# Patient Record
Sex: Female | Born: 1950 | Race: White | Hispanic: No | Marital: Married | Smoking: Former smoker
Health system: Southern US, Community
[De-identification: ages and names within clinical notes are randomized; demographics above are authoritative.]

## PROBLEM LIST (undated history)

## (undated) DIAGNOSIS — G250 Essential tremor: Principal | ICD-10-CM

## (undated) DIAGNOSIS — I719 Aortic aneurysm of unspecified site, without rupture: Secondary | ICD-10-CM

## (undated) DIAGNOSIS — I10 Essential (primary) hypertension: Secondary | ICD-10-CM

## (undated) DIAGNOSIS — IMO0002 Reserved for concepts with insufficient information to code with codable children: Secondary | ICD-10-CM

## (undated) DIAGNOSIS — I499 Cardiac arrhythmia, unspecified: Secondary | ICD-10-CM

## (undated) DIAGNOSIS — F419 Anxiety disorder, unspecified: Secondary | ICD-10-CM

## (undated) DIAGNOSIS — I471 Supraventricular tachycardia: Secondary | ICD-10-CM

## (undated) DIAGNOSIS — G252 Other specified forms of tremor: Principal | ICD-10-CM

## (undated) DIAGNOSIS — K2100 Gastro-esophageal reflux disease with esophagitis, without bleeding: Secondary | ICD-10-CM

## (undated) DIAGNOSIS — J449 Chronic obstructive pulmonary disease, unspecified: Secondary | ICD-10-CM

## (undated) HISTORY — PX: TOTAL THYROIDECTOMY: SHX2547

## (undated) HISTORY — PX: TONSILLECTOMY: SUR1361

## (undated) HISTORY — PX: HEMORROIDECTOMY: SUR656

## (undated) HISTORY — PX: CHOLECYSTECTOMY: SHX55

## (undated) HISTORY — DX: Essential (primary) hypertension: I10

## (undated) HISTORY — DX: Cardiac arrhythmia, unspecified: I49.9

## (undated) HISTORY — DX: Gastro-esophageal reflux disease with esophagitis: K21.0

## (undated) HISTORY — PX: VAGINAL HYSTERECTOMY: SUR661

## (undated) HISTORY — PX: OTHER SURGICAL HISTORY: SHX169

## (undated) HISTORY — PX: BACK SURGERY: SHX140

## (undated) HISTORY — DX: Other specified forms of tremor: G25.2

## (undated) HISTORY — DX: Gastro-esophageal reflux disease with esophagitis, without bleeding: K21.00

## (undated) HISTORY — PX: NECK SURGERY: SHX720

## (undated) HISTORY — DX: Essential tremor: G25.0

## (undated) HISTORY — DX: Supraventricular tachycardia: I47.1

## (undated) HISTORY — DX: Reserved for concepts with insufficient information to code with codable children: IMO0002

## (undated) HISTORY — PX: OOPHORECTOMY: SHX86

## (undated) HISTORY — DX: Chronic obstructive pulmonary disease, unspecified: J44.9

## (undated) HISTORY — PX: LAPAROTOMY: SHX154

## (undated) HISTORY — PX: FOOT SURGERY: SHX648

## (undated) HISTORY — DX: Anxiety disorder, unspecified: F41.9

---

## 2001-07-24 ENCOUNTER — Ambulatory Visit (HOSPITAL_COMMUNITY): Admission: RE | Admit: 2001-07-24 | Discharge: 2001-07-24 | Payer: Self-pay

## 2001-12-29 ENCOUNTER — Encounter: Payer: Self-pay | Admitting: *Deleted

## 2001-12-29 ENCOUNTER — Emergency Department (HOSPITAL_COMMUNITY): Admission: EM | Admit: 2001-12-29 | Discharge: 2001-12-29 | Payer: Self-pay | Admitting: *Deleted

## 2002-08-21 ENCOUNTER — Inpatient Hospital Stay (HOSPITAL_COMMUNITY): Admission: EM | Admit: 2002-08-21 | Discharge: 2002-08-23 | Payer: Self-pay | Admitting: *Deleted

## 2002-08-21 ENCOUNTER — Encounter: Payer: Self-pay | Admitting: *Deleted

## 2002-10-08 ENCOUNTER — Encounter: Payer: Self-pay | Admitting: Emergency Medicine

## 2002-10-08 ENCOUNTER — Emergency Department (HOSPITAL_COMMUNITY): Admission: EM | Admit: 2002-10-08 | Discharge: 2002-10-08 | Payer: Self-pay | Admitting: Emergency Medicine

## 2003-02-19 ENCOUNTER — Ambulatory Visit (HOSPITAL_COMMUNITY): Admission: RE | Admit: 2003-02-19 | Discharge: 2003-02-19 | Payer: Self-pay | Admitting: Pulmonary Disease

## 2003-07-04 ENCOUNTER — Encounter (HOSPITAL_COMMUNITY): Admission: RE | Admit: 2003-07-04 | Discharge: 2003-08-03 | Payer: Self-pay | Admitting: Cardiology

## 2005-10-29 ENCOUNTER — Ambulatory Visit (HOSPITAL_COMMUNITY): Admission: RE | Admit: 2005-10-29 | Discharge: 2005-10-29 | Payer: Self-pay | Admitting: Pulmonary Disease

## 2006-06-29 ENCOUNTER — Ambulatory Visit (HOSPITAL_COMMUNITY): Admission: RE | Admit: 2006-06-29 | Discharge: 2006-06-29 | Payer: Self-pay | Admitting: Pulmonary Disease

## 2006-07-14 ENCOUNTER — Ambulatory Visit (HOSPITAL_COMMUNITY): Admission: RE | Admit: 2006-07-14 | Discharge: 2006-07-14 | Payer: Self-pay | Admitting: Obstetrics and Gynecology

## 2006-07-28 ENCOUNTER — Ambulatory Visit: Payer: Self-pay | Admitting: Cardiology

## 2006-08-04 ENCOUNTER — Encounter (INDEPENDENT_AMBULATORY_CARE_PROVIDER_SITE_OTHER): Payer: Self-pay | Admitting: Specialist

## 2006-08-04 ENCOUNTER — Inpatient Hospital Stay (HOSPITAL_COMMUNITY): Admission: RE | Admit: 2006-08-04 | Discharge: 2006-08-08 | Payer: Self-pay | Admitting: Obstetrics and Gynecology

## 2007-02-21 ENCOUNTER — Ambulatory Visit (HOSPITAL_COMMUNITY): Admission: RE | Admit: 2007-02-21 | Discharge: 2007-02-21 | Payer: Self-pay | Admitting: Pulmonary Disease

## 2007-03-01 ENCOUNTER — Ambulatory Visit (HOSPITAL_COMMUNITY): Admission: RE | Admit: 2007-03-01 | Discharge: 2007-03-01 | Payer: Self-pay | Admitting: Pulmonary Disease

## 2009-01-02 ENCOUNTER — Encounter (HOSPITAL_COMMUNITY): Admission: RE | Admit: 2009-01-02 | Discharge: 2009-02-01 | Payer: Self-pay | Admitting: Endocrinology

## 2009-08-20 ENCOUNTER — Ambulatory Visit (HOSPITAL_COMMUNITY): Admission: RE | Admit: 2009-08-20 | Discharge: 2009-08-20 | Payer: Self-pay | Admitting: Pulmonary Disease

## 2010-01-27 ENCOUNTER — Ambulatory Visit (HOSPITAL_COMMUNITY): Admission: RE | Admit: 2010-01-27 | Discharge: 2010-01-27 | Payer: Self-pay | Admitting: Pulmonary Disease

## 2010-10-15 ENCOUNTER — Inpatient Hospital Stay (HOSPITAL_COMMUNITY): Admission: EM | Admit: 2010-10-15 | Discharge: 2010-05-05 | Payer: Self-pay | Admitting: Emergency Medicine

## 2011-01-24 LAB — BASIC METABOLIC PANEL
BUN: 7 mg/dL (ref 6–23)
Calcium: 9.1 mg/dL (ref 8.4–10.5)
Creatinine, Ser: 0.87 mg/dL (ref 0.4–1.2)
GFR calc Af Amer: >60 mL/min (ref >60)
GFR calc non Af Amer: >60 mL/min (ref >60)
Glucose, Bld: 89 mg/dL (ref 70–99)
Potassium: 3.9 mEq/L (ref 3.5–5.1)
Sodium: 134 mEq/L — ABNORMAL LOW (ref 135–145)

## 2011-01-24 LAB — URINALYSIS, ROUTINE W REFLEX MICROSCOPIC
Bilirubin Urine: NEGATIVE
Nitrite: NEGATIVE
Protein, ur: NEGATIVE mg/dL
Specific Gravity, Urine: 1.01 (ref 1.005–1.030)
Urobilinogen, UA: 0.2 mg/dL (ref 0.0–1.0)

## 2011-01-24 LAB — URINE CULTURE
Colony Count: NO GROWTH
Culture: NO GROWTH

## 2011-01-24 LAB — CBC
HCT: 39.3 % (ref 36.0–46.0)
MCH: 31.8 pg (ref 26.0–34.0)
MCHC: 34 g/dL (ref 30.0–36.0)
MCV: 93.6 fL (ref 78.0–100.0)
RBC: 4.2 MIL/uL (ref 3.87–5.11)
RDW: 13.2 % (ref 11.5–15.5)
WBC: 12.5 10*3/uL — ABNORMAL HIGH (ref 4.0–10.5)

## 2011-01-24 LAB — DIFFERENTIAL
Eosinophils Relative: 1 % (ref 0–5)
Monocytes Absolute: 0.8 10*3/uL (ref 0.1–1.0)

## 2011-01-24 LAB — POCT CARDIAC MARKERS
CKMB, poc: 1.4 ng/mL (ref 1.0–8.0)
CKMB, poc: 3.3 ng/mL (ref 1.0–8.0)
Myoglobin, poc: 249 ng/mL (ref 12–200)
Troponin i, poc: <0.05 ng/mL (ref 0.00–0.09)

## 2011-01-24 LAB — URINE MICROSCOPIC-ADD ON

## 2011-01-24 LAB — LACTIC ACID, PLASMA: Lactic Acid, Venous: 1.1 mmol/L (ref 0.5–2.2)

## 2011-03-26 NOTE — Discharge Summary (Signed)
   NAME:  Aimee Rodriguez, Aimee Rodriguez                           ACCOUNT NO.:  [id number]   MEDICAL RECORD NO.:  [id number]                   PATIENT TYPE:  INP   LOCATION:  A208                                 FACILITY:  [facility]   PHYSICIAN:  Edward L. Juanetta Gosling, M.D.             DATE OF BIRTH:  [birth year]   DATE OF ADMISSION:  08/21/2002  DATE OF DISCHARGE:  08/23/2002                                 DISCHARGE SUMMARY   FINAL DISCHARGE DIAGNOSES:  1. Chronic obstructive pulmonary disease with exacerbation  2. Hypertension.  3. Chronic low back pain.  4. Anxiety.   HISTORY:  The patient is a 60 year old who has a long known history of  problems with breathing and who presented to the emergency room acutely  short of breath.  When she was seen in the emergency room she was noted to  be very short of breath, had a great deal of cough and congestion, and was  admitted to the hospital at that point.  She had been trying over-the-  counter medications at home with no relief.  Her physical exam on admission showed that her chest had marked bilateral  rhonchi, respiratory rate was about 30, her blood pressure in the 120 range,  her abdomen was soft, extremities showed no edema.   HOSPITAL COURSE:  She was treated with intravenous steroids, IV antibiotics,  inhaled bronchodilators, and improved rapidly.  She was discharged home in  improved condition on August 23, 2002.   DISCHARGE MEDICATIONS:  1. Prednisone 40 mg x2 days, 20 mg x2 days, 10 mg x2 days, and then stop.  2. Omnicef 300 mg b.i.d.  3. Codiclear DH 5 cc q.4h. p.r.n. cough.  4. Inhaled albuterol via metered dose inhaler two puffs q.i.d.  5. She is going to continue with her previous medications which include:     a. Klonopin 1 mg at night.     b. Xanax 1 mg t.i.d.     c. Atenolol 25 mg daily.                                               Edward L. Juanetta Gosling, M.D.    ELH/MEDQ  D:  08/23/2002  T:  08/23/2002  Job:  [id number]

## 2011-03-26 NOTE — Letter (Signed)
July 28, 2006     [name], M.D.  [address] [place], [place] [address]   RE:  [name], [name]  MRN:  [id number]  /  DOB:  1951/08/01   Dear [name]:   It was my pleasure assessing Aimee Rodriguez in the office today at your  request and reporting to you concerning her cardiologic status prior to  planned oophorectomy.  This nice woman was first seen by our group in 1997  when she presented with a 10-year history of palpitations.  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  She  had recurrent palpitations a few years later when she was treated with  thyroid hormone and no significant arrhythmias were identified.  A few years  ago, she experienced chest pain, dyspnea and lightheadedness, but did not  have palpitations on that occasion.  It sounds as if she had excessive  bradycardia from medication which was adjusted with resolution of her  symptoms.  She underwent a stress nuclear study at that time which was  negative.  Her continuing cardiovascular risk factors have been principally  use of tobacco products, which she discontinued entirely nine months ago.  She has had hypertension that has been well controlled.  She has been told  that recent cholesterol values were good.  I have only a remote profile from  the year 2000 that was fine.  She has not had diabetes.  She has had no  other vascular problems.   Aimee Rodriguez underwent partial hysterectomy some years ago.  Plans are now  for oophorectomy.   Past medical, family and social history were updated as well as a review of  systems.  She required partial thyroidectomy a few years ago and has been  maintained on levothyroxine.  She was previously treated with a PPI for  GERD, but not recently.  Blood pressure control has apparently been good.  She takes small doses of anti-anxiety medication.   She has no known medical allergies.  She has had nausea when treated  with  CODEINE and DEMEROL.   Her total tobacco consumption was approximately 20-30 pack years.  She  denies excessive use of alcohol.  She has been disabled due to back pain.  She remarried two years ago and has four children from prior marriage.   PHYSICAL EXAMINATION:  GENERAL:  Pleasant, thin woman with somewhat raspy  voice in no acute distress.  VITAL SIGNS:  Weight is 121, eight pounds less than in 2004.  Blood pressure  initially 140/100; subsequently 140/85.  Heart rate 65 and regular.  Respirations 16.  NECK:  No jugular venous distension.  Normal carotid upstrokes without  bruits.  HEENT:  Normal funduscopic examination. EOMs full.  Pupils are equal, round  and reactive to light.  Normal oral mucosa.  ENDOCRINE:  Transverse surgical scar at the base of the neck; no  thyromegaly.  HEMATOPOIETIC:  No adenopathy.  LUNGS:  Clear.  CARDIAC:  Normal first and second heart sounds.  Fourth heart sound present.  ABDOMEN:  Soft and nontender.  No masses.  No organomegaly.  Aortic  pulsation easily palpated.  Appears normal in width.  EXTREMITIES:  No edema.  Normal distal pulses.  NEUROMUSCULAR:  Symmetric strength and tone; normal cranial nerves.  MUSCULOSKELETAL:  No joint deformities.   EKG:  Normal sinus rhythm.  Delayed R wave progression.  Otherwise normal.   IMPRESSION:  Aimee Rodriguez has a history  of paroxysmal supraventricular  tachycardia that was cured with radiofrequency ablation. She has a number of  cardiovascular risk factors, but no known coronary nor vascular disease.  With reasonable exercise tolerance and no symptoms to suggest myocardial  ischemia, no preoperative testing is warranted.  She has issues of  bradycardia on beta blocker. Her current dose of beta blocker is nearly  homeopathic.  I suggested that she continue to take it in the perioperative  interval but discontinue it subsequently.  It sounds as if control of  hypertension is not optimal. She will  collect additional blood pressure  values over the next few weeks and return for reassessment thereafter.  In  the meantime, we will collect recent laboratory studies.  Please call us if  we can assist when she is in hospital for her surgery.   Thanks so much for sending her to [place].   Sincerely,     Gerrit Friends. Dietrich Pates, MD, [facility]   RMR/MedQ DD:  07/28/2006 DT:  07/30/2006 Job #:  [id number]   CC:    Ramon Dredge L. Juanetta Gosling, M.D.

## 2011-03-26 NOTE — Op Note (Signed)
Aimee Rodriguez, Aimee Rodriguez              ACCOUNT NO.:  [id number]   MEDICAL RECORD NO.:  [id number]          PATIENT TYPE:  INP   LOCATION:  A427                          FACILITY:  [facility]   PHYSICIAN:  [name], M.D. DATE OF BIRTH:  1951-10-26   DATE OF PROCEDURE:  DATE OF DISCHARGE:                                 OPERATIVE REPORT   PREOPERATIVE DIAGNOSIS:  Right ovarian mass.   POSTOPERATIVE DIAGNOSIS:  Mucinous cystadenoma, right ovary.   PROCEDURE:  Bilateral salpingo-oophorectomy.   SURGEON:  [name], M.D.   ASSISTANTAnnabell Howells, R.N.   ANESTHESIA:  General.   COMPLICATIONS:  None.   ESTIMATED BLOOD LOSS:  Minimal.   FINDINGS:  Smooth mobile 10-11 cm cyst encompassing right ovary, easily  mobilized with minimal adhesions.   DESCRIPTION OF PROCEDURE:  The patient was taken to the operating room,  prepped and draped for lower abdominal surgery with lower abdominal vertical  incision performed. The peritoneal cavity was opened, pelvic washings taken  and the omentum inspected and found to be visually normal. The liver edges  were palpably normal. Bowel was packed way and pelvis inspected revealing  the smooth, mobile, easily mobilized right tube and ovary. The right ureter  could be identified and was well out of the surgical field. The  infundibulopelvic ligament was cross clamped in 2 pedicles, transected and  specimen sent for frozen section. The 2 pedicle components were each sutured  individually. There was some oozing between and a third suture was used to  complete hemostasis. As stated earlier, the ureter was well out of the area.   The opposite side was addressed while awaiting frozen section results. The  left tube and ovary were smaller, atrophic and were easily identified,  placed on traction, the round ligament taken down first with #0 chromic and  then the infundibulopelvic ligament on the left isolated, clamped, cut and  suture ligated. The pelvis  was irrigated from the minimal amount of bleeding  that had occurred and then the patient allowed to go to the recovery room  after removal of laparotomy equipment and closure of the peritoneum with 2-0  chromic closure of the peritoneal cavity,  continuous running #0 Prolene closure of the fascia followed by subcuticular  running 2-0 plain suture, followed by stapled closure of the skin. The  patient tolerated the procedure well and went to the recovery room in good  condition.      [name], M.D.  Electronically Signed     JVF/MEDQ  D:  08/04/2006  T:  08/06/2006  Job:  [id number]   cc:   Ramon Dredge L. Juanetta Gosling, M.D.  Fax: [phone]

## 2011-03-26 NOTE — Group Therapy Note (Signed)
   NAME:  Aimee Rodriguez, Aimee Rodriguez                           ACCOUNT NO.:  [id number]   MEDICAL RECORD NO.:  [id number]                   PATIENT TYPE:  INP   LOCATION:  A208                                 FACILITY:  [facility]   PHYSICIAN:  Edward L. Juanetta Gosling, M.D.             DATE OF BIRTH:  07-25-1951   DATE OF PROCEDURE:  08/22/2002  DATE OF DISCHARGE:                                   PROGRESS NOTE   SUBJECTIVE:  The patient says that she feels much better.  She was admitted  with an exacerbation of COPD.  She says that she is much improved.  She is  not coughing.  She is not as short of breath.   PHYSICAL EXAMINATION:  CHEST:  Clear with some end-expiratory rhonchi.  HEART:  Regular without gallop.  ABDOMEN:  Soft.  She does not appear to be in acute distress now.   ASSESSMENT:  She does seem to be doing much better.   PLAN:  The plan is to continue with her treatments.  Continue with her  medications.  No changes now.  I am going to check again this evening.  If  she is doing as well as she is now, I may switch her to oral medications and  then she may be able to be discharged fairly soon.                                               Edward L. Juanetta Gosling, M.D.    ELH/MEDQ  D:  08/22/2002  T:  08/23/2002  Job:  [id number]

## 2011-03-26 NOTE — Group Therapy Note (Signed)
   NAME:  Aimee Rodriguez, Aimee Rodriguez                           ACCOUNT NO.:  [id number]   MEDICAL RECORD NO.:  [id number]                   PATIENT TYPE:  INP   LOCATION:  A208                                 FACILITY:  [facility]   PHYSICIAN:  Edward L. Juanetta Gosling, M.D.             DATE OF BIRTH:  08-10-1951   DATE OF PROCEDURE:  08/23/2002  DATE OF DISCHARGE:                                   PROGRESS NOTE   PROBLEM:  Chronic obstructive pulmonary disease with exacerbation.   SUBJECTIVE:  The patient says she is feeling very well this morning and has  no new complaints.  She denies any nausea, vomiting, or diarrhea.  She is  coughing some but not bringing much up.   ASSESSMENT:  She is doing very well.   PLAN:  For discharge today.  See discharge summary for details.                                               Edward L. Juanetta Gosling, M.D.    ELH/MEDQ  D:  08/23/2002  T:  08/24/2002  Job:  [id number]

## 2011-03-26 NOTE — Discharge Summary (Signed)
[name], Aimee Rodriguez              ACCOUNT NO.:  [id number]   MEDICAL RECORD NO.:  [id number]          PATIENT TYPE:  INP   LOCATION:  A427                          FACILITY:  [facility]   PHYSICIAN:  [name], M.D. DATE OF BIRTH:  Apr 07, 1951   DATE OF ADMISSION:  08/04/2006  DATE OF DISCHARGE:  10/01/2007LH                                 DISCHARGE SUMMARY   DIAGNOSIS:  Right ovarian mass, suspected benign.   DISCHARGE DIAGNOSES:  1. Benign mucinous cyst adenoma right ovary.  2. Bronchiolitis with atelectasis and pleural effusion.  3. Postoperative ileus, resolved.  4. Chronic bronchitis due to cigarette smoking.   SURGICAL PROCEDURE:  Laparotomy with bilateral salpingo-oophorectomy  August 04, 2006.   HOSPITAL SUMMARY:  This 60 year old female many years status post vaginal  hysterectomy was admitted for laparotomy removal of ovarian mass and  probable bilateral salpingo-oophorectomy.  Please see the operative note for  details.  Frozen section indicated a mucinous cyst adenoma as the diagnosis.   Postoperatively, the patient did well for 24-48 hours, but then had  postoperative ileus.  She ended up two additional days due to upper  respiratory problems.  She has a long history of chronic bronchitis,  worsened by smoking.  The patient had azithromycin restarted, produced a  slightly bloody mucous sputum.  She improved on the azithromycin.  She did  have an additional postoperative adynamic ileus developed, but had an  essentially normal white count.  At the time of discharge, white count is  10,900, with 73 neutrophils, hemoglobin 12, hematocrit 35.  The patient was  improved on the final day of hospitalization, remaining afebrile and was  discharged home in stable condition to be seen back in four days for staple  removal.      [name], M.D.  Electronically Signed     JVF/MEDQ  D:  08/08/2006  T:  08/09/2006  Job:  [id number]   cc:   [facility]   Oneal Deputy. Juanetta Gosling, M.D.  Fax: [phone]

## 2011-03-26 NOTE — H&P (Signed)
NAME:  Aimee Rodriguez, STEGMAN              ACCOUNT NO.:  [id number]   MEDICAL RECORD NO.:  [id number]          PATIENT TYPE:  AMB   LOCATION:  DAY                           FACILITY:  [facility]   PHYSICIAN:  [name], M.D. DATE OF BIRTH:  1951-09-24   DATE OF ADMISSION:  DATE OF DISCHARGE:  LH                                HISTORY & PHYSICAL   ADMISSION DIAGNOSES:  Right ovarian mass, suspected benign ovarian tumor,  scheduled for laparotomy and bilateral salpingo-oophorectomy.   HISTORY OF PRESENT ILLNESS:  This 60 year old female many years status post  vaginal hysterectomy performed June 09, 1992, was admitted for laparotomy  and removal of tubes and ovaries bilaterally.  She has an ovarian mass found  on CT scan, found by Dr. Juanetta Gosling in referral.  CT scan showed a large pelvic  mass, 11 cm maximum diameter by 7.2 x8.0 cm.  There is no lymphadenopathy.  There is no free pelvic fluid.  There is some mild compression effect on the  right ureter.  There is no evidence of metastatic disease or lymph node  enlargement. CA125 has been ordered which is within normal range at 16.5  making malignancy highly unlikely.  The patient is aware that final  diagnosis awaits pathology interpretation.   REVIEW OF SYSTEMS:  Positive for a sense of pelvic pressure and a sense of  incomplete voiding.  CT was ordered as part of this work-up.   PAST MEDICAL HISTORY:  Notable for heart disease managed by Dr. Juanetta Gosling and  Dr. Dietrich Pates.  She has been cleared for surgery by cardiology on 9/20.  Hypertension, controlled.  History of paroxysmal supraventricular  tachycardia (PSVT) which was cured by ablation of the aberrant conducting  pathway 1997.   PAST SURGICAL HISTORY:  Vaginal hysterectomy in 1993, back fusion 1993,  tonsillectomy as a child, foot surgery, neck fusion 1994, colon polypectomy  2001.   PHYSICAL EXAMINATION:  GENERAL:  Shows a slim Caucasian female, alert and  oriented x3.  HEENT:   Pupils equal, round and reactive to light.  NECK:  Supple.  CARDIAC:  Regular rate and rhythm.  Pulse 70's.  ABDOMEN:  Nontender to palpation.  Bimanual exam:  Sensitive mass in the  right side, 11 cm, filling pelvis nicely.  EXTREMITIES:  Negative for cyanosis, clubbing or edema.   PLAN:  Laparotomy with removal of ovarian mass planned for August 04, 2006 at 9:30 a.m.      [name], M.D.  Electronically Signed     JVF/MEDQ  D:  08/03/2006  T:  08/04/2006  Job:  [id number]   cc:   Ramon Dredge L. Juanetta Gosling, M.D.  Fax: [phone]

## 2011-03-26 NOTE — H&P (Signed)
NAME:  Aimee Rodriguez, Aimee Rodriguez                           ACCOUNT NO.:  [id number]   MEDICAL RECORD NO.:  [id number]                   PATIENT TYPE:  INP   LOCATION:  A208                                 FACILITY:  [facility]   PHYSICIAN:  Hanley Hays. Dechurch, M.D.           DATE OF BIRTH:  [birth year]   DATE OF ADMISSION:  08/21/2002  DATE OF DISCHARGE:                                HISTORY & PHYSICAL   HISTORY OF PRESENT ILLNESS:  A 60 year old white female patient of Dr.  Juanetta Gosling with a past medical history of seasonal bronchitis, coronary artery  disease, status post stent in 1997, hypertension, who is disabled secondary  to chronic back pain and multiple other issues who presented with a three-  day history of increasing cough, wheezing, shortness of breath not  responsive to outpatient over-the-counter therapy.  She received nebulizers  and Solu-Medrol in the emergency room without improvement.  Blood gas  revealed a PCO2 of 37, PO2 of 48, pH 7.4 on room air.  Chest x-ray reveals  no infiltrate or other changes.  She was admitted to the hospital for  ongoing treatment.   MEDICATIONS:  1. Atenolol 25 daily.  2. Aspirin 325 q.o.d.  3. Levoxyl dose unknown.  4. Xanax 1 mg t.i.d.  5. Klonopin 1 mg at bedtime.  6. Altace 5 mg daily.  7. Nexium 40 mg daily.  8. Nitroglycerin.  The patient just recently learned how to use it.   ALLERGIES:  1. CODEINE.  2. DEMEROL.   PAST MEDICAL HISTORY:  1. Seasonal bronchitis usually in the spring.  2. Coronary artery disease, status post stent in 1997, followed by Dr.     Dietrich Pates though not seen in the last two years.  3. Lumbosacral disk surgery.  4. Cholecystectomy.  5. Hysterectomy.  6. Thyroidectomy followed by Dr. Patrecia Pace.  7. Colon polyps.   REVIEW OF SYSTEMS:  Headache over the last six months, especially in the  morning and occasionally at night.  Chest pain with exertion.  She tends to  limit her exertion so as not to bring it on.  She  has had no recent exercise  studies or followup by cardiology.  No prolonged pain.  She recently learned  how to use nitroglycerin she says.  She notes occasional left arm pain which  will go away with rest.  Poor sleep.  Her weight is stable though she has a  hard time maintaining it or so she says.  Esophagitis helped with Nexium.  History of blockages in  the neck though I cannot find any documentation to  support this.  No endocrine complaints.  GU is negative.  She is status post  hysterectomy but had intact ovaries.   LABORATORY DATA:  No labs were obtained during the ER evaluation.   SOCIAL HISTORY:  She has a fiance.  She has four children, one with thyroid  problems.  Tobacco:  About eight cigarettes per day.  Alcohol:  No abuse.  Disabled.   FAMILY HISTORY:  Coronary artery disease, hypertension, diabetes mellitus.  Her mother had chronic lung problems.   PHYSICAL EXAMINATION:  GENERAL:  Well-developed, well-nourished thin white  female with a coarse cough.  No acute shortness of breath.  Somewhat  tremulous from her multiple medications.  NECK:  Supple.  No JVD or adenopathy.  The oropharynx is moist.  No thrush.  LUNGS:  Coarse, leathery rhonchi throughout with poor air movement.  HEART:  Regular.  Rate is 88.  No murmur.  ABDOMEN:  Flat, soft, nontender.  No mass.  EXTREMITIES:  Without clubbing, cyanosis, or edema.  NEUROLOGIC:  Grossly intact.   ASSESSMENT:  1. Hypoxemic respiratory failure.  2. Chronic obstructive pulmonary disease exacerbation.  3. Coronary artery disease by history with question of exertional symptoms     although no pain this evening or other new complaints.  4. History of chronic anxiety.   PLAN:  1. The patient will be admitted to 2A.  She will continue Solu-Medrol,     Rocephin, and nebulizer therapy and oxygen at this point and be     reevaluated after 24-48 hours of therapy.  Continue her cardioprotective     medications.  Consider  cardiology referral.  No acute need at this point.     This was discussed with the patient.  2. Chronic anxiety.  Unlikely this patient will want to change her regimen.     We discussed this at length.  No changes at this time.  We will defer to     primary physician.  3. History of hypothyroidism on Levoxyl.  Dose will need to be confirmed.  4. Reflux esophagitis.  Continue proton pump inhibitor during hospital stay.                                                     Hanley Hays Josefine Class, M.D.    FED/MEDQ  D:  08/21/2002  T:  08/22/2002  Job:  [id number]

## 2011-11-10 ENCOUNTER — Other Ambulatory Visit (HOSPITAL_COMMUNITY): Payer: Self-pay | Admitting: Pulmonary Disease

## 2011-11-10 ENCOUNTER — Ambulatory Visit (HOSPITAL_COMMUNITY)
Admission: RE | Admit: 2011-11-10 | Discharge: 2011-11-10 | Disposition: A | Discharge status: Home / Self Care | Payer: Medicare Other | Source: Ambulatory Visit | Attending: Pulmonary Disease | Admitting: Pulmonary Disease

## 2011-11-10 DIAGNOSIS — J4489 Other specified chronic obstructive pulmonary disease: Secondary | ICD-10-CM | POA: Insufficient documentation

## 2011-11-10 DIAGNOSIS — J449 Chronic obstructive pulmonary disease, unspecified: Secondary | ICD-10-CM | POA: Insufficient documentation

## 2011-11-10 DIAGNOSIS — R05 Cough: Secondary | ICD-10-CM

## 2011-11-10 DIAGNOSIS — R059 Cough, unspecified: Secondary | ICD-10-CM | POA: Insufficient documentation

## 2011-11-10 LAB — HEPATIC FUNCTION PANEL
ALT: 18 U/L (ref 7–35)
AST: 20 U/L
Alkaline Phosphatase: 58 U/L
Bilirubin, Direct: 0.1 mg/dL (ref 0.01–0.4)
Bilirubin, Indirect: 0.3
Total Protein: 6.2 g/dL

## 2011-11-10 LAB — CBC WITH DIFFERENTIAL/PLATELET
HCT: 48 %
Hemoglobin: 16.2 g/dL — AB (ref 12.0–16.0)
MCH: 31.3
MCHC: 33.7
MCV: 93 fL
RDW: 13.4

## 2011-11-23 ENCOUNTER — Ambulatory Visit (INDEPENDENT_AMBULATORY_CARE_PROVIDER_SITE_OTHER): Payer: Medicare Other | Admitting: Gastroenterology

## 2011-11-23 ENCOUNTER — Encounter: Payer: Self-pay | Admitting: Gastroenterology

## 2011-11-23 VITALS — BP 138/93 | HR 81 | Temp 98.2°F | Ht 63.0 in | Wt 96.8 lb

## 2011-11-23 DIAGNOSIS — R1013 Epigastric pain: Secondary | ICD-10-CM

## 2011-11-23 DIAGNOSIS — R634 Abnormal weight loss: Secondary | ICD-10-CM

## 2011-11-23 MED ORDER — OMEPRAZOLE 20 MG PO CPDR: 20.0000 mg | DELAYED_RELEASE_CAPSULE | Freq: Every day | ORAL | Status: DC

## 2011-11-23 NOTE — Assessment & Plan Note (Signed)
Concerning for occult malignancy. Will need to assess with EGD due to prominent UGI symptoms to include epigastric pain, dysphagia. No melena, hematochezia, change in bowel habits noted. Will hold off on colonoscopy unless indicated. Retrieving labs from Dr. Juanetta Gosling as well as plan regarding work-up for nodule on neck. EGD as planned.

## 2011-11-23 NOTE — Progress Notes (Signed)
Faxed to PCP

## 2011-11-23 NOTE — Assessment & Plan Note (Signed)
61 year old with epigastric pain, "ice sensation" in epigastric region with eating and drinking since Nov 2012. No associated nausea, vomiting, loss of appetite. Reports significant wt loss from 118 in November to currently 96, unintentionally. Takes Aleve sparingly for back pain, no melena or hematochezia noted. Associated dysphagia with eating, pt attributes to possible nodule anterior neck. Last EGD and TCS by Dr. Lovell Sheehan in remote past, records requested. Concerning for possible underlying malignancy due to constellation of symptoms. Anticipate CT scan in future after EGD is completed. Will need to discuss with Dr. Juanetta Gosling the plan for possible nodule.   Proceed with upper endoscopy in the near future with Dr. Jena Gauss. The risks, benefits, and alternatives have been discussed in detail with patient. They have stated understanding and desire to proceed.  Obtain labs from Dr. Juanetta Gosling Anticipate further work-up to include CT scan in near future Start Prilosec 20 mg daily Obtain reports from EGD/TCS by Dr. Lovell Sheehan

## 2011-11-23 NOTE — Progress Notes (Signed)
Primary Care Physician:  Fredirick Maudlin, MD, MD Primary Gastroenterologist:  Dr. Jena Gauss  Chief Complaint  Patient presents with  . Abdominal Pain    HPI:   [name] is a 61 year old female who presents today at the request of Dr. Juanetta Gosling secondary to abdominal pain and weight loss. She reports epigastric pain with eating, intermittent, since Nov 2012. States it feels at times like a "cold spot" with eating and drinking. She reports sensation of "ice" in her abdomen. Reports feeling cold all the time. Also notes dysphagia, "knot" in her throat. Has BM every 2 days, denies constipation or diarrhea. No melena or hematochezia. No loss of appetite, nausea or vomiting. She has lost significant amount of weight from 118 in November to currently 96 pounds. Only takes Aleve prn. Last EGD and TCS were done by Lovell Sheehan, date unknown, but patient believes at least 6 or 7 years ago. She reports polyps were removed but believes they were benign. Extensive labs were ordered by Dr. Juanetta Gosling, and we have requested these. Patient also reports a "knot" anterior throat. Feels like it is "tight". Apparently, Dr. Juanetta Gosling will be ordering some type of ultrasound or scan for this. I will need to get in touch with him to discuss the actual plan.   Past Medical History  Diagnosis Date  . Degeneration of intervertebral disc   . Cardiac dysrhythmia, unspecified   . Anxiety   . Reflux esophagitis   . HTN (hypertension)   . Chronic bronchitis   . COPD (chronic obstructive pulmonary disease)   . Paroxysmal supraventricular tachycardia     s/p ablation in past    Past Surgical History  Procedure Date  . Laparotomy   . Total thyroidectomy   . Vaginal hysterectomy   . Oophorectomy   . Back surgery   . Tonsillectomy   . Foot surgery     X2  . Neck surgery   . Polyps of colon   . Cholecystectomy   . Cardiac ablation for paroxysmal supraventricular tachycardia     Current Outpatient Prescriptions    Medication Sig Dispense Refill  . albuterol (PROVENTIL) (2.5 MG/3ML) 0.083% nebulizer solution Take 2.5 mg by nebulization every 6 (six) hours as needed.      . ALPRAZolam (XANAX) 1 MG tablet Take 1 mg by mouth 3 (three) times daily as needed.      . clonazePAM (KLONOPIN) 1 MG tablet Take 1 mg by mouth 2 (two) times daily as needed.      Marland Kitchen guaiFENesin (MUCINEX) 600 MG 12 hr tablet Take 1,200 mg by mouth 2 (two) times daily.      Marland Kitchen omeprazole (PRILOSEC) 20 MG capsule Take 1 capsule (20 mg total) by mouth daily. Take 30 minutes before breakfast daily  30 capsule  1  . ramipril (ALTACE) 5 MG capsule         Allergies as of 11/23/2011 - Review Complete 11/23/2011  Allergen Reaction Noted  . Codeine Nausea And Vomiting 11/23/2011  . Demerol Nausea And Vomiting 11/23/2011  . Sulfa antibiotics Hives 11/23/2011    Family History  Problem Relation Age of Onset  . Cancer Brother   . Cancer Sister   . Colon cancer Neg Hx     History   Social History  . Marital Status: Married    Spouse Name: N/A    Number of Children: N/A  . Years of Education: N/A   Occupational History  . Not on file.   Social History Main Topics  .  Smoking status: Former Smoker -- 1.0 packs/day    Types: Cigarettes  . Smokeless tobacco: Not on file   Comment: quit about 3 yrs   . Alcohol Use: No  . Drug Use: No  . Sexually Active: Not on file   Other Topics Concern  . Not on file   Social History Narrative  . No narrative on file    Review of Systems: Gen: SEE HPI CV: Denies chest pain, heart palpitations, peripheral edema, syncope.  Resp: Denies shortness of breath at rest. Complains of SOB with exertion. Denies wheezing or cough.  GI: Denies dysphagia or odynophagia. Denies jaundice, hematemesis, fecal incontinence. GU : Denies urinary burning, urinary frequency, urinary hesitancy MS: + chronic back pain, denies muscle weakness, cramps, or limitation of movement.  Derm: Denies rash, itching, dry  skin Psych: Denies depression, anxiety, memory loss, and confusion Heme: Denies bruising, bleeding, and enlarged lymph nodes.  Physical Exam: BP 138/93  Pulse 81  Temp(Src) 98.2 F (36.8 C) (Temporal)  Ht 5\' 3"  (1.6 m)  Wt 96 lb 12.8 oz (43.908 kg)  BMI 17.15 kg/m2 General:   Alert and oriented. Pleasant and cooperative. Thin-appearing.  Head:  Normocephalic and atraumatic. Eyes:  Without icterus, sclera clear and conjunctiva pink.  Ears:  Normal auditory acuity. Nose:  No deformity, discharge,  or lesions. Mouth:  No deformity or lesions, oral mucosa pink.  Neck:  Supple, prominent anterior neck with possible nodule.  Lungs:  Clear to auscultation bilaterally. No wheezes, rales, or rhonchi. No distress.  Heart:  S1, S2 present without murmurs appreciated.  Abdomen:  THIN. +BS, soft, non-tender and non-distended. No HSM noted. No guarding or rebound. No masses appreciated.  Rectal:  Deferred  Msk:  Symmetrical without gross deformities. Normal posture. Extremities:  Without clubbing or edema. Neurologic:  Alert and  oriented x4;  grossly normal neurologically. Skin:  Intact without significant lesions or rashes. Cervical Nodes:  No significant cervical adenopathy. Psych:  Alert and cooperative. Normal mood and affect.

## 2011-11-23 NOTE — Patient Instructions (Signed)
We have set you up for an upper endoscopy with Dr. Jena Gauss in the near future. In the meantime, start taking Prilosec 20 mg each day, 30 minutes before breakfast.  I am retrieved the results from Dr. Juanetta Gosling and your previous colonoscopy reports. We may end up doing further testing after talking with Dr. Juanetta Gosling about his plan.  Further recommendations to follow in the very near future.

## 2011-11-24 MED ORDER — SODIUM CHLORIDE 0.45 % IV SOLN
Freq: Once | INTRAVENOUS | Status: AC
  Administered 2011-11-25: 1000 mL via INTRAVENOUS

## 2011-11-24 NOTE — Progress Notes (Signed)
Have requested labs from Dr. Juanetta Gosling twice. As of 1/16 do not have yet. Will still proceed with EGD and continue to try and obtain.

## 2011-11-25 ENCOUNTER — Encounter (HOSPITAL_COMMUNITY): Payer: Self-pay | Admitting: *Deleted

## 2011-11-25 ENCOUNTER — Ambulatory Visit (HOSPITAL_COMMUNITY)
Admission: RE | Admit: 2011-11-25 | Discharge: 2011-11-25 | Disposition: A | Discharge status: Home / Self Care | Payer: Medicare Other | Source: Ambulatory Visit | Attending: Internal Medicine | Admitting: Internal Medicine

## 2011-11-25 ENCOUNTER — Encounter (HOSPITAL_COMMUNITY)
Admission: RE | Disposition: A | Discharge status: Home / Self Care | Payer: Self-pay | Source: Ambulatory Visit | Attending: Internal Medicine

## 2011-11-25 ENCOUNTER — Other Ambulatory Visit: Payer: Self-pay | Admitting: Internal Medicine

## 2011-11-25 DIAGNOSIS — Z79899 Other long term (current) drug therapy: Secondary | ICD-10-CM | POA: Insufficient documentation

## 2011-11-25 DIAGNOSIS — R634 Abnormal weight loss: Secondary | ICD-10-CM

## 2011-11-25 DIAGNOSIS — D131 Benign neoplasm of stomach: Secondary | ICD-10-CM

## 2011-11-25 DIAGNOSIS — R52 Pain, unspecified: Secondary | ICD-10-CM

## 2011-11-25 DIAGNOSIS — J449 Chronic obstructive pulmonary disease, unspecified: Secondary | ICD-10-CM | POA: Insufficient documentation

## 2011-11-25 DIAGNOSIS — I10 Essential (primary) hypertension: Secondary | ICD-10-CM | POA: Insufficient documentation

## 2011-11-25 DIAGNOSIS — R1013 Epigastric pain: Secondary | ICD-10-CM

## 2011-11-25 DIAGNOSIS — R1310 Dysphagia, unspecified: Secondary | ICD-10-CM

## 2011-11-25 DIAGNOSIS — J4489 Other specified chronic obstructive pulmonary disease: Secondary | ICD-10-CM | POA: Insufficient documentation

## 2011-11-25 HISTORY — PX: ESOPHAGOGASTRODUODENOSCOPY: SHX5428

## 2011-11-25 SURGERY — EGD (ESOPHAGOGASTRODUODENOSCOPY)
Anesthesia: Moderate Sedation

## 2011-11-25 MED ORDER — STERILE WATER FOR IRRIGATION IR SOLN
Status: DC | PRN
  Administered 2011-11-25: 13:00:00

## 2011-11-25 MED ORDER — MIDAZOLAM HCL 5 MG/5ML IJ SOLN
INTRAMUSCULAR | Status: AC
  Filled 2011-11-25: qty 10

## 2011-11-25 MED ORDER — PROMETHAZINE HCL 25 MG/ML IJ SOLN
INTRAMUSCULAR | Status: AC
  Filled 2011-11-25: qty 1

## 2011-11-25 MED ORDER — FENTANYL CITRATE 0.05 MG/ML IJ SOLN
INTRAMUSCULAR | Status: DC | PRN
  Administered 2011-11-25 (×2): 50 ug via INTRAVENOUS
  Administered 2011-11-25: 25 ug via INTRAVENOUS
  Administered 2011-11-25: 50 ug via INTRAVENOUS
  Administered 2011-11-25: 25 ug via INTRAVENOUS

## 2011-11-25 MED ORDER — FENTANYL CITRATE 0.05 MG/ML IJ SOLN
INTRAMUSCULAR | Status: AC
  Filled 2011-11-25: qty 4

## 2011-11-25 MED ORDER — PROMETHAZINE HCL 25 MG/ML IJ SOLN
INTRAMUSCULAR | Status: DC | PRN
  Administered 2011-11-25: 25 mg via INTRAVENOUS

## 2011-11-25 MED ORDER — BUTAMBEN-TETRACAINE-BENZOCAINE 2-2-14 % EX AERO
INHALATION_SPRAY | CUTANEOUS | Status: DC | PRN
  Administered 2011-11-25: 2 via TOPICAL

## 2011-11-25 MED ORDER — MIDAZOLAM HCL 5 MG/5ML IJ SOLN
INTRAMUSCULAR | Status: DC | PRN
  Administered 2011-11-25 (×5): 2 mg via INTRAVENOUS

## 2011-11-25 NOTE — H&P (Signed)
  I have seen & examined the patient prior to the procedure(s) today and reviewed the history and physical/consultation.  There have been no changes.  After consideration of the risks, benefits, alternatives and imponderables, the patient has consented to the procedure(s).   

## 2011-11-25 NOTE — Op Note (Signed)
[facility] [address] [place], [place]  [address]  ENDOSCOPY PROCEDURE REPORT  PATIENT:  Aimee Rodriguez, Aimee Rodriguez  MR#:  [id number] BIRTHDATE:  January 19, 1951, 60 yrs. old  GENDER:  female  ENDOSCOPIST:  R. Roetta Sessions, MD Caleen Essex Referred by:  Kari Baars, M.D.  PROCEDURE DATE:  11/25/2011 PROCEDURE:  EGD with Elease Hashimoto dilation and gastric and small bowel biopsy  INDICATIONS:  epigastric pain weight loss esophageal dysphagia  INFORMED CONSENT:   The risks, benefits, limitations, alternatives and imponderables have been discussed.  The potential for biopsy, esophogeal dilation, etc. have also been reviewed.  Questions have been answered.  All parties agreeable.  Please see the history and physical in the medical record for more information.  MEDICATIONS:  fentanyl 200 mcg IV Versed 10 mg IV and 25 mg of Phenergan IV Cetacaine spray  DESCRIPTION OF PROCEDURE:   The AO-[id number] ([id number]) endoscope was introduced through the mouth and advanced to the second portion of the duodenum without difficulty or limitations.  The mucosal surfaces were surveyed very carefully during advancement of the scope and upon withdrawal.  Retroflexion view of the proximal stomach and esophagogastric junction was performed.  <<PROCEDUREIMAGES>>  FINDINGS:  normal appearing patent tubular esophagus. Stomach empt. Small hiatal hernia. Single 3 mm fundal gland type polyp in the     fundus. The remainder of the stomach appeared normal. Examination of the first and second and third portion of the duodenum revealed possible minimal scalloping of the fold tips.  THERAPEUTIC / DIAGNOSTIC MANEUVERS PERFORMED:  A 54 French Maloney dilator was passed to full insertion with ease.  A look back revealed no apparent complication related to passage of the dilator. Subsequently, the single gastric polyp was cold biopsied/removed. Finally, biopsies of the second third portion of duodenum were  taken.  COMPLICATIONS:   None  IMPRESSION:      Normal esophagus-status post Maloney dilation. Small hiatal hernia. Single gastric polyp-removed. Possible subtle abnormality of the small bowel status post biopsy.   Today's findings do not obviously explain this lady's symptoms.  RECOMMENDATIONS:   We'll proceed with an abdominal pelvic CT. Follow up on pathology.  ______________________________ R. Roetta Sessions, MD Caleen Essex  CC:  n. eSIGNED:   R. Roetta Sessions at 11/25/2011 01:47 PM  Oretha Caprice, [phone]

## 2011-11-30 ENCOUNTER — Telehealth: Payer: Self-pay

## 2011-11-30 ENCOUNTER — Other Ambulatory Visit (HOSPITAL_COMMUNITY): Payer: Medicare Other

## 2011-11-30 ENCOUNTER — Ambulatory Visit (HOSPITAL_COMMUNITY): Admission: RE | Admit: 2011-11-30 | Payer: Medicare Other | Source: Ambulatory Visit

## 2011-11-30 NOTE — Telephone Encounter (Signed)
CT  Cancelled - spoke w/ Misty Stanley

## 2011-11-30 NOTE — Telephone Encounter (Signed)
Pt does not want to have a CT scan because she is not not hurt anymore and she feels it is unnecessary to have now. She has been feeling better after the EGD.  She would like for [place] to call X-ray to cancel the CT scan.

## 2011-12-01 ENCOUNTER — Ambulatory Visit (HOSPITAL_COMMUNITY): Admission: RE | Admit: 2011-12-01 | Payer: Medicare Other | Source: Ambulatory Visit

## 2011-12-01 ENCOUNTER — Other Ambulatory Visit (HOSPITAL_COMMUNITY): Payer: Self-pay | Admitting: Pulmonary Disease

## 2011-12-01 DIAGNOSIS — R221 Localized swelling, mass and lump, neck: Secondary | ICD-10-CM

## 2011-12-01 NOTE — Telephone Encounter (Deleted)
Forward to [employer]

## 2011-12-01 NOTE — Telephone Encounter (Signed)
Noted. Pt really needs evaluation due to wt loss and pain. This is significant wt loss she has experienced. Please have her follow-up with [place] in our office.  I am still awaiting op notes from Dr. Lovell Sheehan' TCS years ago as well as current labs/office visit most recently from Dr. Juanetta Gosling. Can we check on these? It is important we have so we may know how to proceed. Thanks!

## 2011-12-02 ENCOUNTER — Ambulatory Visit (HOSPITAL_COMMUNITY)
Admission: RE | Admit: 2011-12-02 | Discharge: 2011-12-02 | Disposition: A | Discharge status: Home / Self Care | Payer: Medicare Other | Source: Ambulatory Visit | Attending: Pulmonary Disease | Admitting: Pulmonary Disease

## 2011-12-02 DIAGNOSIS — E0789 Other specified disorders of thyroid: Secondary | ICD-10-CM | POA: Insufficient documentation

## 2011-12-02 DIAGNOSIS — R221 Localized swelling, mass and lump, neck: Secondary | ICD-10-CM

## 2011-12-02 DIAGNOSIS — R220 Localized swelling, mass and lump, head: Secondary | ICD-10-CM | POA: Insufficient documentation

## 2011-12-02 MED ORDER — IOHEXOL 300 MG/ML  SOLN
80.0000 mL | Freq: Once | INTRAMUSCULAR | Status: AC | PRN
  Administered 2011-12-02: 80 mL via INTRAVENOUS

## 2011-12-04 ENCOUNTER — Encounter: Payer: Self-pay | Admitting: Internal Medicine

## 2011-12-06 ENCOUNTER — Encounter (HOSPITAL_COMMUNITY): Payer: Self-pay | Admitting: Internal Medicine

## 2012-01-19 ENCOUNTER — Other Ambulatory Visit (HOSPITAL_COMMUNITY): Payer: Medicare Other

## 2012-03-07 NOTE — Progress Notes (Signed)
Labs from Dr. Juanetta Gosling Jan 2013: Normal LFTs TSH normal H.pylori antibody negative (0.85) hgb 16.2, Hct 48.1

## 2012-05-17 ENCOUNTER — Other Ambulatory Visit (HOSPITAL_COMMUNITY): Payer: Self-pay | Admitting: Pulmonary Disease

## 2012-05-17 DIAGNOSIS — R634 Abnormal weight loss: Secondary | ICD-10-CM

## 2012-05-24 ENCOUNTER — Ambulatory Visit (HOSPITAL_COMMUNITY)
Admission: RE | Admit: 2012-05-24 | Discharge: 2012-05-24 | Disposition: A | Discharge status: Home / Self Care | Payer: Medicare Other | Source: Ambulatory Visit | Attending: Pulmonary Disease | Admitting: Pulmonary Disease

## 2012-05-24 DIAGNOSIS — R911 Solitary pulmonary nodule: Secondary | ICD-10-CM | POA: Insufficient documentation

## 2012-05-24 DIAGNOSIS — R634 Abnormal weight loss: Secondary | ICD-10-CM | POA: Insufficient documentation

## 2012-05-24 DIAGNOSIS — I712 Thoracic aortic aneurysm, without rupture, unspecified: Secondary | ICD-10-CM | POA: Insufficient documentation

## 2012-05-24 DIAGNOSIS — J438 Other emphysema: Secondary | ICD-10-CM | POA: Insufficient documentation

## 2012-05-24 DIAGNOSIS — I10 Essential (primary) hypertension: Secondary | ICD-10-CM | POA: Insufficient documentation

## 2012-05-24 MED ORDER — IOHEXOL 300 MG/ML  SOLN
100.0000 mL | Freq: Once | INTRAMUSCULAR | Status: AC | PRN
  Administered 2012-05-24: 100 mL via INTRAVENOUS

## 2012-08-24 ENCOUNTER — Other Ambulatory Visit: Payer: Self-pay | Admitting: Gastroenterology

## 2012-11-07 ENCOUNTER — Ambulatory Visit (HOSPITAL_COMMUNITY)
Admission: RE | Admit: 2012-11-07 | Discharge: 2012-11-07 | Disposition: A | Discharge status: Home / Self Care | Payer: Medicare Other | Source: Ambulatory Visit | Attending: Pulmonary Disease | Admitting: Pulmonary Disease

## 2012-11-07 ENCOUNTER — Other Ambulatory Visit (HOSPITAL_COMMUNITY): Payer: Self-pay | Admitting: Pulmonary Disease

## 2012-11-07 DIAGNOSIS — R51 Headache: Secondary | ICD-10-CM | POA: Insufficient documentation

## 2012-11-07 DIAGNOSIS — R05 Cough: Secondary | ICD-10-CM | POA: Insufficient documentation

## 2012-11-07 DIAGNOSIS — R0981 Nasal congestion: Secondary | ICD-10-CM

## 2012-11-07 DIAGNOSIS — R059 Cough, unspecified: Secondary | ICD-10-CM | POA: Insufficient documentation

## 2012-12-18 ENCOUNTER — Other Ambulatory Visit (HOSPITAL_COMMUNITY): Payer: Self-pay | Admitting: Pulmonary Disease

## 2012-12-18 DIAGNOSIS — R222 Localized swelling, mass and lump, trunk: Secondary | ICD-10-CM

## 2012-12-18 DIAGNOSIS — I729 Aneurysm of unspecified site: Secondary | ICD-10-CM

## 2012-12-26 ENCOUNTER — Ambulatory Visit (HOSPITAL_COMMUNITY)
Admission: RE | Admit: 2012-12-26 | Discharge: 2012-12-26 | Disposition: A | Discharge status: Home / Self Care | Payer: Medicare Other | Source: Ambulatory Visit | Attending: Pulmonary Disease | Admitting: Pulmonary Disease

## 2012-12-26 DIAGNOSIS — R911 Solitary pulmonary nodule: Secondary | ICD-10-CM | POA: Insufficient documentation

## 2012-12-26 DIAGNOSIS — I712 Thoracic aortic aneurysm, without rupture, unspecified: Secondary | ICD-10-CM | POA: Insufficient documentation

## 2012-12-26 DIAGNOSIS — R222 Localized swelling, mass and lump, trunk: Secondary | ICD-10-CM

## 2012-12-26 DIAGNOSIS — I729 Aneurysm of unspecified site: Secondary | ICD-10-CM

## 2012-12-26 MED ORDER — IOHEXOL 350 MG/ML SOLN
100.0000 mL | Freq: Once | INTRAVENOUS | Status: AC | PRN
  Administered 2012-12-26: 100 mL via INTRAVENOUS

## 2013-07-03 ENCOUNTER — Other Ambulatory Visit (HOSPITAL_COMMUNITY): Payer: Self-pay | Admitting: Pulmonary Disease

## 2013-07-03 DIAGNOSIS — R911 Solitary pulmonary nodule: Secondary | ICD-10-CM

## 2013-07-06 ENCOUNTER — Ambulatory Visit (HOSPITAL_COMMUNITY)
Admission: RE | Admit: 2013-07-06 | Discharge: 2013-07-06 | Disposition: A | Discharge status: Home / Self Care | Payer: Medicare Other | Source: Ambulatory Visit | Attending: Pulmonary Disease | Admitting: Pulmonary Disease

## 2013-07-06 DIAGNOSIS — J438 Other emphysema: Secondary | ICD-10-CM | POA: Insufficient documentation

## 2013-07-06 DIAGNOSIS — R911 Solitary pulmonary nodule: Secondary | ICD-10-CM | POA: Insufficient documentation

## 2013-07-06 DIAGNOSIS — I77810 Thoracic aortic ectasia: Secondary | ICD-10-CM | POA: Insufficient documentation

## 2013-07-06 MED ORDER — IOHEXOL 300 MG/ML  SOLN
80.0000 mL | Freq: Once | INTRAMUSCULAR | Status: AC | PRN
  Administered 2013-07-06: 80 mL via INTRAVENOUS

## 2013-07-10 LAB — POCT I-STAT, CHEM 8
BUN: 11 mg/dL (ref 6–23)
Hemoglobin: 16.7 g/dL — ABNORMAL HIGH (ref 12.0–15.0)
Potassium: 4.3 mEq/L (ref 3.5–5.1)
Sodium: 139 mEq/L (ref 135–145)
TCO2: 28 mmol/L (ref 0–100)

## 2013-07-18 ENCOUNTER — Ambulatory Visit: Payer: Medicare Other | Admitting: Cardiovascular Disease

## 2013-08-06 ENCOUNTER — Ambulatory Visit: Payer: Medicare Other | Admitting: Cardiology

## 2013-08-16 ENCOUNTER — Encounter: Payer: Self-pay | Admitting: Cardiology

## 2013-08-16 ENCOUNTER — Encounter: Payer: Self-pay | Admitting: *Deleted

## 2013-08-16 ENCOUNTER — Ambulatory Visit (INDEPENDENT_AMBULATORY_CARE_PROVIDER_SITE_OTHER): Payer: Medicare Other | Admitting: Cardiology

## 2013-08-16 VITALS — BP 137/99 | HR 74 | Ht 62.0 in | Wt 96.5 lb

## 2013-08-16 DIAGNOSIS — I10 Essential (primary) hypertension: Secondary | ICD-10-CM

## 2013-08-16 DIAGNOSIS — R0609 Other forms of dyspnea: Secondary | ICD-10-CM

## 2013-08-16 DIAGNOSIS — R911 Solitary pulmonary nodule: Secondary | ICD-10-CM

## 2013-08-16 DIAGNOSIS — I77819 Aortic ectasia, unspecified site: Secondary | ICD-10-CM | POA: Insufficient documentation

## 2013-08-16 DIAGNOSIS — R0600 Dyspnea, unspecified: Secondary | ICD-10-CM

## 2013-08-16 NOTE — Progress Notes (Signed)
Clinical Summary Aimee Rodriguez is a 62 y.o.female referred by Dr Juanetta Gosling, she was found to have an incidental findings of coronary atherosclerosis on chest CT.   1. Coronary atherosclerosis CT chest for lung nodule showed atherosclerosis of thoracic aorta and coronaries. 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  - no orthopnea, no PND, no LE - denies any chest pain. Describes some DOE at approx 1-2 blocks she attributes to her sinus drainage that developed approx 1 year ago. Fairly sedentary lifestyle.  CAD risk factors: former tobacco x 50 years, HTN, brother MI in 56s, father MI age 34s.     Past Medical History  Diagnosis Date  . Degeneration of intervertebral disc   . Cardiac dysrhythmia, unspecified   . Anxiety   . Reflux esophagitis   . HTN (hypertension)   . Chronic bronchitis   . COPD (chronic obstructive pulmonary disease)   . Paroxysmal supraventricular tachycardia     s/p ablation in past     Allergies  Allergen Reactions  . Codeine Nausea And Vomiting  . Demerol Nausea And Vomiting  . Sulfa Antibiotics Hives     Current Outpatient Prescriptions  Medication Sig Dispense Refill  . albuterol (PROVENTIL) (2.5 MG/3ML) 0.083% nebulizer solution Take 2.5 mg by nebulization every 6 (six) hours as needed.      . ALPRAZolam (XANAX) 1 MG tablet Take 1 mg by mouth 3 (three) times daily as needed.      . clonazePAM (KLONOPIN) 1 MG tablet Take 1 mg by mouth 2 (two) times daily as needed.      Marland Kitchen guaiFENesin (MUCINEX) 600 MG 12 hr tablet Take 1,200 mg by mouth 2 (two) times daily.      Marland Kitchen omeprazole (PRILOSEC) 20 MG capsule TAKE 1 CAPSULE BY MOUTH DAILY. TAKE 30 MINUTES BEFORE BREAKFAST DAILY  30 capsule  5  . ramipril (ALTACE) 5 MG capsule        No current facility-administered medications for this visit.     Past Surgical History  Procedure Laterality Date  . Laparotomy    . Total thyroidectomy    . Vaginal  hysterectomy    . Oophorectomy    . Back surgery    . Tonsillectomy    . Foot surgery      X2  . Neck surgery    . Polyps of colon    . Cholecystectomy    . Cardiac ablation for paroxysmal supraventricular tachycardia    . Hemorroidectomy    . Esophagogastroduodenoscopy  11/25/2011    Procedure: ESOPHAGOGASTRODUODENOSCOPY (EGD);  Surgeon: Corbin Ade, MD;  Location: [facility];  Service: Endoscopy;  Laterality: N/A;  12:45     Allergies  Allergen Reactions  . Codeine Nausea And Vomiting  . Demerol Nausea And Vomiting  . Sulfa Antibiotics Hives      Family History  Problem Relation Age of Onset  . Cancer Brother   . Cancer Sister   . Colon cancer Neg Hx      Social History Aimee Rodriguez reports that she has quit smoking. Her smoking use included Cigarettes. She smoked 1.00 pack per day. She does not have any smokeless tobacco history on file. Aimee Rodriguez reports that she does not drink alcohol.   Review of Systems CONSTITUTIONAL: No weight loss, fever, chills, weakness or fatigue.  HEENT: Eyes: No visual loss, blurred vision, double vision or yellow sclerae.No hearing loss, sneezing, congestion, runny nose or sore  throat.  SKIN: No rash or itching.  CARDIOVASCULAR: per HPI RESPIRATORY: per HPI GASTROINTESTINAL: No anorexia, nausea, vomiting or diarrhea. No abdominal pain or blood.  GENITOURINARY: No burning on urination, no polyuria NEUROLOGICAL: No headache, dizziness, syncope, paralysis, ataxia, numbness or tingling in the extremities. No change in bowel or bladder control.  MUSCULOSKELETAL: No muscle, back pain, joint pain or stiffness.  LYMPHATICS: No enlarged nodes. No history of splenectomy.  PSYCHIATRIC: No history of depression or anxiety.  ENDOCRINOLOGIC: No reports of sweating, cold or heat intolerance. No polyuria or polydipsia.  Marland Kitchen   Physical Examination p 74 bp 137/99 Weight 96 lbs BMI 17 Gen: resting comfortably, no acute distress HEENT: no  scleral icterus, pupils equal round and reactive, no palptable cervical adenopathy,  CV: RRR, no m/r/g, no JVD, no carotid bruits Resp: Clear to auscultation bilaterally GI: abdomen is soft, non-tender, non-distended, normal bowel sounds, no hepatosplenomegaly MSK: extremities are warm, no edema.  Skin: warm, no rash Neuro:  no focal deficits Psych: appropriate affect  Pertinent Labs 08/02/13: Hgb 16 Hct 47.2 TC 201 TG 84 HDL 91 LDL 93 TSH 2.4    EKG: SR, normal axis, PR 160, QRS 80 ms, QTc 380, non-spec ST/T changes  Assessment and Plan  1. Coronary atherosclerosis - incidental finding on CT for lung nodule - patient denies any chest pain, but does have some DOE - she has multiple CAD risk factors - will perform an exercise stress echo to further evaluate - start ASA 81mg  qday      Antoine Poche, M.D., F.A.C.C.

## 2013-08-16 NOTE — Patient Instructions (Addendum)
Your physician recommends that you schedule a follow-up appointment in: 3 weeks  Your physician has requested that you have a stress echocardiogram. For further information please visit [email or web address] Please follow instruction sheet as given.  WE WILL CALL YOU WITH YOUR TEST RESULTS/INSTRUCTIONS/NEXT STEPS ONCE RECEIVED BY THE PROVIDER   1) START ASPIRIN 81MG  ONCE DAILY

## 2013-08-17 ENCOUNTER — Encounter: Payer: Self-pay | Admitting: Cardiology

## 2013-08-28 ENCOUNTER — Ambulatory Visit (HOSPITAL_COMMUNITY)
Admission: RE | Admit: 2013-08-28 | Discharge: 2013-08-28 | Disposition: A | Discharge status: Home / Self Care | Payer: Medicare Other | Source: Ambulatory Visit | Attending: Cardiology | Admitting: Cardiology

## 2013-08-28 ENCOUNTER — Encounter (HOSPITAL_COMMUNITY): Payer: Self-pay

## 2013-08-28 DIAGNOSIS — J449 Chronic obstructive pulmonary disease, unspecified: Secondary | ICD-10-CM | POA: Insufficient documentation

## 2013-08-28 DIAGNOSIS — I471 Supraventricular tachycardia, unspecified: Secondary | ICD-10-CM | POA: Insufficient documentation

## 2013-08-28 DIAGNOSIS — I10 Essential (primary) hypertension: Secondary | ICD-10-CM

## 2013-08-28 DIAGNOSIS — R0600 Dyspnea, unspecified: Secondary | ICD-10-CM

## 2013-08-28 DIAGNOSIS — R0989 Other specified symptoms and signs involving the circulatory and respiratory systems: Secondary | ICD-10-CM | POA: Insufficient documentation

## 2013-08-28 DIAGNOSIS — J4489 Other specified chronic obstructive pulmonary disease: Secondary | ICD-10-CM | POA: Insufficient documentation

## 2013-08-28 DIAGNOSIS — Z87891 Personal history of nicotine dependence: Secondary | ICD-10-CM | POA: Insufficient documentation

## 2013-08-28 DIAGNOSIS — R0609 Other forms of dyspnea: Secondary | ICD-10-CM | POA: Insufficient documentation

## 2013-08-28 DIAGNOSIS — R0602 Shortness of breath: Secondary | ICD-10-CM

## 2013-08-28 NOTE — Progress Notes (Signed)
*  PRELIMINARY RESULTS* Echocardiogram Echocardiogram Stress Test has been performed.  Shauniece Kwan 08/28/2013, 10:43 AM

## 2013-08-28 NOTE — Progress Notes (Signed)
Stress Lab Nurses Notes - Aimee Rodriguez  Aimee Rodriguez 08/28/2013 Reason for doing test: Dyspnea Type of test: Stress Echo Nurse performing test: Parke Poisson, RN Nuclear Medicine Tech: Not Applicable Echo Tech: Veda Canning MD performing test: Koneswaran/K. Lawrence NP Family MD: Juanetta Gosling Test explained and consent signed: yes IV started: No IV started Symptoms: SOB & fatigue in legs Treatment/Intervention: None Reason test stopped: reached target HR After recovery IV was: NA Patient to return to Nuc. Med at : NA Patient discharged: Home Patient's Condition upon discharge was: stable Comments: During test peak BP 163/106 & HR 137 .  Recovery BP 139/83 & HR 77 .  Symptoms resolved in recovery. Erskine Speed T

## 2013-08-31 ENCOUNTER — Telehealth: Payer: Self-pay | Admitting: *Deleted

## 2013-08-31 NOTE — Telephone Encounter (Signed)
Patient may enjoy her grilled cheese sandwich

## 2013-08-31 NOTE — Telephone Encounter (Signed)
.  left message to have patient return my call.  

## 2013-08-31 NOTE — Telephone Encounter (Signed)
Please respond asap

## 2013-08-31 NOTE — Telephone Encounter (Signed)
Pt states she wants to know if she can have a grilled cheese sandwich. She had a call that stress test was normal.

## 2013-09-03 NOTE — Telephone Encounter (Signed)
.  left message to have patient return my call.  

## 2013-09-04 NOTE — Telephone Encounter (Signed)
Left detailed message on pt answering machine it is ok to proceed with ingestion of grilled cheese sandwiches and if any further assistance is needed to please call the office

## 2013-09-10 ENCOUNTER — Ambulatory Visit: Payer: Medicare Other | Admitting: Cardiology

## 2013-09-13 ENCOUNTER — Ambulatory Visit (INDEPENDENT_AMBULATORY_CARE_PROVIDER_SITE_OTHER): Payer: Medicare Other | Admitting: Cardiology

## 2013-09-13 ENCOUNTER — Encounter: Payer: Self-pay | Admitting: Cardiology

## 2013-09-13 VITALS — BP 136/78 | HR 82 | Ht 62.0 in | Wt 96.8 lb

## 2013-09-13 DIAGNOSIS — I2510 Atherosclerotic heart disease of native coronary artery without angina pectoris: Secondary | ICD-10-CM

## 2013-09-13 NOTE — Progress Notes (Signed)
Clinical Summary   Aimee Rodriguez is a 62 y.o.female referred by Dr Aimee Rodriguez, she was found to have an incidental finding of coronary atherosclerosis on chest CT as well as mild dilatation of the thoracic aorta   1. Coronary atherosclerosis  CT chest for lung nodule showed atherosclerosis of thoracic aorta and coronaries. 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  - no orthopnea, no PND, no LE  - denies any chest pain. Describes some DOE at approx 1-2 blocks she attributes to her sinus drainage that developed approx 1 year ago. Fairly sedentary lifestyle.   - CAD risk factors: former tobacco x 50 years, HTN, brother MI in 17s, father MI age 61s.   - since our last visit she reports no change in symptoms, she completed her stress test.     Past Medical History  Diagnosis Date  . Degeneration of intervertebral disc   . Cardiac dysrhythmia, unspecified   . Anxiety   . Reflux esophagitis   . HTN (hypertension)   . Chronic bronchitis   . COPD (chronic obstructive pulmonary disease)   . Paroxysmal supraventricular tachycardia     s/p ablation in past     Allergies  Allergen Reactions  . Cephalexin     nausea  . Codeine Nausea And Vomiting  . Demerol Nausea And Vomiting  . Sulfa Antibiotics Hives     Current Outpatient Prescriptions  Medication Sig Dispense Refill  . albuterol (PROVENTIL) (2.5 MG/3ML) 0.083% nebulizer solution Take 2.5 mg by nebulization every 6 (six) hours as needed.      . ALPRAZolam (XANAX) 1 MG tablet Take 1 mg by mouth 3 (three) times daily as needed.      . cefUROXime (CEFTIN) 500 MG tablet Take 500 mg by mouth as needed.       . clonazePAM (KLONOPIN) 1 MG tablet Take 1 mg by mouth 2 (two) times daily as needed.      Marland Kitchen guaiFENesin (MUCINEX) 600 MG 12 hr tablet Take 1,200 mg by mouth 2 (two) times daily.      Marland Kitchen omeprazole (PRILOSEC) 20 MG capsule TAKE 1 CAPSULE BY MOUTH DAILY. TAKE 30 MINUTES BEFORE  BREAKFAST DAILY  30 capsule  5  . ramipril (ALTACE) 5 MG capsule        No current facility-administered medications for this visit.     Past Surgical History  Procedure Laterality Date  . Laparotomy    . Total thyroidectomy    . Vaginal hysterectomy    . Oophorectomy    . Back surgery    . Tonsillectomy    . Foot surgery      X2  . Neck surgery    . Polyps of colon    . Cholecystectomy    . Cardiac ablation for paroxysmal supraventricular tachycardia    . Hemorroidectomy    . Esophagogastroduodenoscopy  11/25/2011    Procedure: ESOPHAGOGASTRODUODENOSCOPY (EGD);  Surgeon: Corbin Ade, MD;  Location: [facility];  Service: Endoscopy;  Laterality: N/A;  12:45     Allergies  Allergen Reactions  . Cephalexin     nausea  . Codeine Nausea And Vomiting  . Demerol Nausea And Vomiting  . Sulfa Antibiotics Hives      Family History  Problem Relation Age of Onset  . Cancer Brother   . Cancer Sister   . Colon cancer Neg Hx      Social History Aimee Rodriguez reports that she  has quit smoking. Her smoking use included Cigarettes. She smoked 1.00 pack per day. She does not have any smokeless tobacco history on file. Aimee Rodriguez reports that she does not drink alcohol.   Review of Systems CONSTITUTIONAL: No weight loss, fever, chills, weakness or fatigue.  HEENT: Eyes: No visual loss, blurred vision, double vision or yellow sclerae.No hearing loss, sneezing, congestion, runny nose or sore throat.  SKIN: No rash or itching.  CARDIOVASCULAR: per HPI RESPIRATORY: per HPI GASTROINTESTINAL: No anorexia, nausea, vomiting or diarrhea. No abdominal pain or blood.  GENITOURINARY: No burning on urination, no polyuria NEUROLOGICAL: No headache, dizziness, syncope, paralysis, ataxia, numbness or tingling in the extremities. No change in bowel or bladder control.  MUSCULOSKELETAL: No muscle, back pain, joint pain or stiffness.  LYMPHATICS: No enlarged nodes. No history of  splenectomy.  PSYCHIATRIC: No history of depression or anxiety.  ENDOCRINOLOGIC: No reports of sweating, cold or heat intolerance. No polyuria or polydipsia.  Marland Kitchen   Physical Examination p 82 bp 136/78 Wt 96 lbs BMI 18 Gen: resting comfortably, no acute distress HEENT: no scleral icterus, pupils equal round and reactive, no palptable cervical adenopathy,  CV: RRR, no m/r/g, no JVD, no carotid bruits Resp: Clear to auscultation bilaterally GI: abdomen is soft, non-tender, non-distended, normal bowel sounds, no hepatosplenomegaly MSK: extremities are warm, no edema.  Skin: warm, no rash Neuro:  no focal deficits Psych: appropriate affect   Diagnostic Studies  Pertinent Labs  08/02/13: Hgb 16 Hct 47.2 TC 201 TG 84 HDL 91 LDL 93 TSH 2.4   EKG: SR, normal axis, PR 160, QRS 80 ms, QTc 380, non-spec ST/T changes   08/28/13 Stress Echo Stage 1, 87% of THR, below average exercise tolerance,  Non-diagnostic ST/T changes, normal resting LVEF with normal response to stress. Negative study. Functional capacity limited by leg weakness.   Assessment and Plan    1. Coronary atherosclerosis  - incidental finding on CT for lung nodule  - normal exercise stress echo, no evidence of functionally significant obstructive CAD - continue age appropriate risk factor modification - start ASA 81 mg daily  2. Thoracic aorta dilatation - patient will be receiving routine CT scans of her chest for a pulmonary nodule, continue to follow aorta with these scans - continue blood pressure control       Antoine Poche, M.D., F.A.C.C.

## 2013-09-13 NOTE — Patient Instructions (Addendum)
Your physician recommends that you schedule a follow-up appointment in: ONE YEAR   Your physician has recommended you make the following change in your medication:   1) START ASPIRIN 81MG  ONCE DAILY

## 2014-02-20 ENCOUNTER — Other Ambulatory Visit (HOSPITAL_COMMUNITY): Payer: Self-pay | Admitting: Pulmonary Disease

## 2014-02-20 DIAGNOSIS — R911 Solitary pulmonary nodule: Secondary | ICD-10-CM

## 2014-02-21 ENCOUNTER — Ambulatory Visit (HOSPITAL_COMMUNITY)
Admission: RE | Admit: 2014-02-21 | Discharge: 2014-02-21 | Disposition: A | Discharge status: Home / Self Care | Payer: Medicare Other | Source: Ambulatory Visit | Attending: Pulmonary Disease | Admitting: Pulmonary Disease

## 2014-02-21 DIAGNOSIS — I2510 Atherosclerotic heart disease of native coronary artery without angina pectoris: Secondary | ICD-10-CM | POA: Insufficient documentation

## 2014-02-21 DIAGNOSIS — I712 Thoracic aortic aneurysm, without rupture, unspecified: Secondary | ICD-10-CM | POA: Insufficient documentation

## 2014-02-21 DIAGNOSIS — R918 Other nonspecific abnormal finding of lung field: Secondary | ICD-10-CM | POA: Insufficient documentation

## 2014-02-21 DIAGNOSIS — R911 Solitary pulmonary nodule: Secondary | ICD-10-CM

## 2014-02-21 DIAGNOSIS — I7090 Unspecified atherosclerosis: Secondary | ICD-10-CM | POA: Insufficient documentation

## 2014-02-21 DIAGNOSIS — J438 Other emphysema: Secondary | ICD-10-CM | POA: Insufficient documentation

## 2014-02-21 LAB — POCT I-STAT CREATININE: Creatinine, Ser: 1 mg/dL (ref 0.50–1.10)

## 2014-02-21 MED ORDER — IOHEXOL 300 MG/ML  SOLN
80.0000 mL | Freq: Once | INTRAMUSCULAR | Status: AC | PRN
  Administered 2014-02-21: 80 mL via INTRAVENOUS

## 2014-06-27 ENCOUNTER — Encounter: Payer: Self-pay | Admitting: *Deleted

## 2014-06-28 ENCOUNTER — Encounter: Payer: Self-pay | Admitting: Neurology

## 2014-06-28 ENCOUNTER — Ambulatory Visit (INDEPENDENT_AMBULATORY_CARE_PROVIDER_SITE_OTHER): Payer: Medicare Other | Admitting: Neurology

## 2014-06-28 VITALS — BP 150/97 | HR 85 | Ht 62.0 in | Wt 99.0 lb

## 2014-06-28 DIAGNOSIS — G252 Other specified forms of tremor: Secondary | ICD-10-CM

## 2014-06-28 DIAGNOSIS — G250 Essential tremor: Secondary | ICD-10-CM

## 2014-06-28 HISTORY — DX: Essential tremor: G25.2

## 2014-06-28 HISTORY — DX: Essential tremor: G25.0

## 2014-06-28 MED ORDER — PRIMIDONE 50 MG PO TABS: ORAL_TABLET | ORAL | Status: DC

## 2014-06-28 NOTE — Patient Instructions (Signed)

## 2014-06-28 NOTE — Progress Notes (Signed)
Reason for visit: Tremor  Aimee Rodriguez is a 63 y.o. female  History of present illness:  Aimee Rodriguez is a 63 year old right-handed white female with a history of a tremor that began approximately 2 years ago, mainly affecting the head and neck area. The patient does have some tremor affecting the arms, but this is not significant. The patient has a sensation of tremor internally that she feels going throughout her body, and down the legs. The patient indicates that the tremor generally is better in the morning, and worse later in the day. The patient is on albuterol inhalers, but she indicates that this does not worsen the tremor. She has had recent thyroid studies done that were unremarkable, with a TSH of 3.8. The patient denies any significant balance issues or numbness or weakness of the extremities. She denies problems controlling the bowels or the bladder. She denies a family history of tremors. She denies any significant issues with handwriting or with feeding herself. The patient is on clonazepam and alprazolam without much benefit with the tremors. She takes 1 mg of clonazepam once or twice daily, and 1 mg of Xanax 3 times daily. She is sent to this office for an evaluation.  Past Medical History  Diagnosis Date  . Degeneration of intervertebral disc   . Cardiac dysrhythmia, unspecified   . Anxiety   . Reflux esophagitis   . HTN (hypertension)   . Chronic bronchitis   . COPD (chronic obstructive pulmonary disease)   . Paroxysmal supraventricular tachycardia     s/p ablation in past  . Essential and other specified forms of tremor 06/28/2014    Past Surgical History  Procedure Laterality Date  . Laparotomy    . Total thyroidectomy    . Vaginal hysterectomy    . Oophorectomy    . Back surgery    . Tonsillectomy    . Foot surgery Right     X2  . Neck surgery    . Polyps of colon    . Cholecystectomy    . Cardiac ablation for paroxysmal supraventricular tachycardia     . Hemorroidectomy    . Esophagogastroduodenoscopy  11/25/2011    Procedure: ESOPHAGOGASTRODUODENOSCOPY (EGD);  Surgeon: Daneil Dolin, MD;  Location: [facility];  Service: Endoscopy;  Laterality: N/A;  12:45    Family History  Problem Relation Age of Onset  . Cancer Brother   . Cancer Sister   . Colon cancer Neg Hx   . Lung disease Mother   . Heart disease Mother   . Heart disease Father   . Stroke Father   . Heart attack Brother     Social history:  reports that she has quit smoking. Her smoking use included Cigarettes. She smoked 1.00 pack per day. She has never used smokeless tobacco. She reports that she does not drink alcohol or use illicit drugs.  Medications:  Current Outpatient Prescriptions on File Prior to Visit  Medication Sig Dispense Refill  . albuterol (PROVENTIL) (2.5 MG/3ML) 0.083% nebulizer solution Take 2.5 mg by nebulization every 6 (six) hours as needed.      . ALPRAZolam (XANAX) 1 MG tablet Take 1 mg by mouth 3 (three) times daily as needed.      . clonazePAM (KLONOPIN) 1 MG tablet Take 1 mg by mouth 2 (two) times daily as needed.      Marland Kitchen guaiFENesin (MUCINEX) 600 MG 12 hr tablet Take 1,200 mg by mouth 2 (two) times daily.      Marland Kitchen  omeprazole (PRILOSEC) 20 MG capsule TAKE 1 CAPSULE BY MOUTH DAILY. TAKE 30 MINUTES BEFORE BREAKFAST DAILY  30 capsule  5  . ramipril (ALTACE) 5 MG capsule        No current facility-administered medications on file prior to visit.      Allergies  Allergen Reactions  . Cephalexin     nausea  . Codeine Nausea And Vomiting  . Demerol Nausea And Vomiting  . Sulfa Antibiotics Hives    ROS:  Out of a complete 14 system review of symptoms, the patient complains only of the following symptoms, and all other reviewed systems are negative.  Tremor  Blood pressure 150/97, pulse 85, height 5\' 2"  (1.575 m), weight 99 lb (44.906 kg).  Physical Exam  General: The patient is alert and cooperative at the time of the  examination.  Eyes: Pupils are equal, round, and reactive to light. Discs are flat bilaterally.  Neck: The neck is supple, no carotid bruits are noted.  Respiratory: The respiratory examination is clear.  Cardiovascular: The cardiovascular examination reveals a regular rate and rhythm, no obvious murmurs or rubs are noted.  Skin: Extremities are without significant edema.  Neurologic Exam  Mental status: The Mini-Mental status examination done today shows a total score 27/30.  Cranial nerves: Facial symmetry is present. There is good sensation of the face to pinprick and soft touch bilaterally. The strength of the facial muscles and the muscles to head turning and shoulder shrug are normal bilaterally. Speech is well enunciated, no aphasia or dysarthria is noted. Extraocular movements are full. Visual fields are full. The tongue is midline, and the patient has symmetric elevation of the soft palate. No obvious hearing deficits are noted. A prominent head and neck tremor is noted, no vocal tremor is seen.  Motor: The motor testing reveals 5 over 5 strength of all 4 extremities. Good symmetric motor tone is noted throughout.  Sensory: Sensory testing is intact to pinprick, soft touch, vibration sensation, and position sense on all 4 extremities. No evidence of extinction is noted.  Coordination: Cerebellar testing reveals good finger-nose-finger and heel-to-shin bilaterally. A mild intention tremor seen with finger-nose-finger bilaterally.  Gait and station: Gait is normal. Tandem gait is slightly unsteady. Romberg is negative. No drift is seen.  Reflexes: Deep tendon reflexes are symmetric and normal bilaterally, with exception that the ankle jerk reflexes are depressed bilaterally. Toes are downgoing bilaterally.   Assessment/Plan:  1. Probable essential tremor  The patient appears to have a prominent head and neck side to side tremor that likely represents an essential tremor. The  patient does not have a family history of tremor, however. The patient is already on high-dose benzodiazepines without benefit of the tremor. We will give the patient a trial on Mysoline to see if this improves her tremor issues. She will followup in about 3-4 months.  Jill Alexanders MD 06/28/2014 7:26 PM  [facility] [address] [place], [place] [address]  Phone [phone] Fax [phone]

## 2014-07-04 ENCOUNTER — Ambulatory Visit (INDEPENDENT_AMBULATORY_CARE_PROVIDER_SITE_OTHER): Payer: Medicare Other | Admitting: Otolaryngology

## 2014-07-04 DIAGNOSIS — H903 Sensorineural hearing loss, bilateral: Secondary | ICD-10-CM

## 2014-07-04 DIAGNOSIS — H9319 Tinnitus, unspecified ear: Secondary | ICD-10-CM

## 2014-07-04 DIAGNOSIS — R0982 Postnasal drip: Secondary | ICD-10-CM

## 2014-07-04 DIAGNOSIS — J310 Chronic rhinitis: Secondary | ICD-10-CM

## 2014-08-08 ENCOUNTER — Encounter: Payer: Self-pay | Admitting: Pulmonary Disease

## 2014-09-03 ENCOUNTER — Emergency Department (HOSPITAL_COMMUNITY)
Admission: EM | Admit: 2014-09-03 | Discharge: 2014-09-03 | Disposition: A | Discharge status: Home / Self Care | Payer: Medicare Other | Attending: Emergency Medicine | Admitting: Emergency Medicine

## 2014-09-03 ENCOUNTER — Emergency Department (HOSPITAL_COMMUNITY): Payer: Medicare Other

## 2014-09-03 ENCOUNTER — Encounter (HOSPITAL_COMMUNITY): Payer: Self-pay | Admitting: Emergency Medicine

## 2014-09-03 DIAGNOSIS — R093 Abnormal sputum: Secondary | ICD-10-CM

## 2014-09-03 DIAGNOSIS — M159 Polyosteoarthritis, unspecified: Secondary | ICD-10-CM | POA: Diagnosis not present

## 2014-09-03 DIAGNOSIS — Z79899 Other long term (current) drug therapy: Secondary | ICD-10-CM | POA: Diagnosis not present

## 2014-09-03 DIAGNOSIS — F419 Anxiety disorder, unspecified: Secondary | ICD-10-CM | POA: Insufficient documentation

## 2014-09-03 DIAGNOSIS — I10 Essential (primary) hypertension: Secondary | ICD-10-CM | POA: Insufficient documentation

## 2014-09-03 DIAGNOSIS — K210 Gastro-esophageal reflux disease with esophagitis: Secondary | ICD-10-CM | POA: Insufficient documentation

## 2014-09-03 DIAGNOSIS — I471 Supraventricular tachycardia: Secondary | ICD-10-CM | POA: Insufficient documentation

## 2014-09-03 DIAGNOSIS — J441 Chronic obstructive pulmonary disease with (acute) exacerbation: Secondary | ICD-10-CM | POA: Insufficient documentation

## 2014-09-03 DIAGNOSIS — R251 Tremor, unspecified: Secondary | ICD-10-CM | POA: Diagnosis not present

## 2014-09-03 DIAGNOSIS — R0602 Shortness of breath: Secondary | ICD-10-CM | POA: Diagnosis present

## 2014-09-03 DIAGNOSIS — R0600 Dyspnea, unspecified: Secondary | ICD-10-CM

## 2014-09-03 DIAGNOSIS — Z9889 Other specified postprocedural states: Secondary | ICD-10-CM | POA: Insufficient documentation

## 2014-09-03 DIAGNOSIS — Z87891 Personal history of nicotine dependence: Secondary | ICD-10-CM | POA: Insufficient documentation

## 2014-09-03 LAB — BASIC METABOLIC PANEL
Anion gap: 14 (ref 5–15)
BUN: 15 mg/dL (ref 6–23)
CO2: 28 mEq/L (ref 19–32)
Calcium: 9.9 mg/dL (ref 8.4–10.5)
Chloride: 95 mEq/L — ABNORMAL LOW (ref 96–112)
Creatinine, Ser: 0.77 mg/dL (ref 0.50–1.10)
GFR calc Af Amer: >90 mL/min (ref >90)
GFR calc non Af Amer: 87 mL/min — ABNORMAL LOW (ref >90)
Glucose, Bld: 85 mg/dL (ref 70–99)
Potassium: 4.3 mEq/L (ref 3.7–5.3)
Sodium: 137 mEq/L (ref 137–147)

## 2014-09-03 LAB — CBC WITH DIFFERENTIAL/PLATELET
Basophils Absolute: 0 10*3/uL (ref 0.0–0.1)
Basophils Relative: 0 % (ref 0–1)
Eosinophils Absolute: 0 10*3/uL (ref 0.0–0.7)
Eosinophils Relative: 1 % (ref 0–5)
HCT: 46.2 % — ABNORMAL HIGH (ref 36.0–46.0)
Hemoglobin: 15.8 g/dL — ABNORMAL HIGH (ref 12.0–15.0)
Lymphocytes Relative: 25 % (ref 12–46)
Lymphs Abs: 2.1 10*3/uL (ref 0.7–4.0)
MCH: 31 pg (ref 26.0–34.0)
MCHC: 34.2 g/dL (ref 30.0–36.0)
MCV: 90.6 fL (ref 78.0–100.0)
Monocytes Absolute: 0.6 10*3/uL (ref 0.1–1.0)
Monocytes Relative: 7 % (ref 3–12)
Neutro Abs: 5.8 10*3/uL (ref 1.7–7.7)
Neutrophils Relative %: 67 % (ref 43–77)
Platelets: 319 10*3/uL (ref 150–400)
RBC: 5.1 MIL/uL (ref 3.87–5.11)
RDW: 13.4 % (ref 11.5–15.5)
WBC: 8.5 10*3/uL (ref 4.0–10.5)

## 2014-09-03 MED ORDER — HYDROCOD POLST-CHLORPHEN POLST 10-8 MG/5ML PO LQCR: 5.0000 mL | Freq: Two times a day (BID) | ORAL | Status: DC | PRN

## 2014-09-03 MED ORDER — PREDNISONE 20 MG PO TABS: 40.0000 mg | ORAL_TABLET | Freq: Every day | ORAL | Status: DC

## 2014-09-03 MED ORDER — IPRATROPIUM-ALBUTEROL 0.5-2.5 (3) MG/3ML IN SOLN
3.0000 mL | Freq: Once | RESPIRATORY_TRACT | Status: AC
  Administered 2014-09-03: 3 mL via RESPIRATORY_TRACT
  Filled 2014-09-03: qty 3

## 2014-09-03 MED ORDER — PREDNISONE 50 MG PO TABS
60.0000 mg | ORAL_TABLET | Freq: Once | ORAL | Status: AC
  Administered 2014-09-03: 60 mg via ORAL
  Filled 2014-09-03 (×2): qty 1

## 2014-09-03 NOTE — Discharge Instructions (Signed)

## 2014-09-03 NOTE — ED Notes (Signed)
Worsening shortness of breath for the past few days.

## 2014-09-03 NOTE — ED Provider Notes (Signed)
CSN: [id number]     Arrival date & time 09/03/14  1422 History  This chart was scribed for Virgel Manifold, MD by Rayfield Citizen, ED Scribe. This patient was seen in room [facility] and the patient's care was started at 3:20 PM.     Chief Complaint  Patient presents with  . Shortness of Breath   HPI  HPI Comments: Aimee Rodriguez is a 63 y.o. female with past medical history of COPD and chronic bronchitis who presents to the Emergency Department complaining of 3 days of worsening SOB. 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 This congestion is somewhat painful to her; it is a constant sensation of "something in her lungs." She utilized her albuterol in this morning with no relief; she does not utilize home oxygen. She denies fevers, chills, nausea, leg pain or swelling, urinary symptoms, new ear pain or sensations. She denies any other significant pain.   She does not take any medications for her sinuses at this time; she previously took a "spray" but it was ineffective, so she no longer utilizes this.   She also notes a painful area under her tongue that has not healed; she noticed this three days ago. It bothers her particularly with mouth movement. Her mouth does not feel dry.    Past Medical History  Diagnosis Date  . Degeneration of intervertebral disc   . Cardiac dysrhythmia, unspecified   . Anxiety   . Reflux esophagitis   . HTN (hypertension)   . Chronic bronchitis   . COPD (chronic obstructive pulmonary disease)   . Paroxysmal supraventricular tachycardia     s/p ablation in past  . Essential and other specified forms of tremor 06/28/2014   Past Surgical History  Procedure Laterality Date  . Laparotomy    . Total thyroidectomy    . Vaginal hysterectomy    . Oophorectomy    . Back surgery    . Tonsillectomy    .  Foot surgery Right     X2  . Neck surgery    . Polyps of colon    . Cholecystectomy    . Cardiac ablation for paroxysmal supraventricular tachycardia    . Hemorroidectomy    . Esophagogastroduodenoscopy  11/25/2011    Procedure: ESOPHAGOGASTRODUODENOSCOPY (EGD);  Surgeon: Daneil Dolin, MD;  Location: [facility];  Service: Endoscopy;  Laterality: N/A;  12:45   Family History  Problem Relation Age of Onset  . Cancer Brother   . Cancer Sister   . Colon cancer Neg Hx   . Lung disease Mother   . Heart disease Mother   . Heart disease Father   . Stroke Father   . Heart attack Brother    History  Substance Use Topics  . Smoking status: Former Smoker -- 1.00 packs/day    Types: Cigarettes  . Smokeless tobacco: Never Used     Comment: quit about 3 yrs   . Alcohol Use: No   OB History   Grav Para Term Preterm Abortions TAB SAB Ect Mult Living                 Review of Systems  Constitutional: Negative for fever and chills.  HENT: Positive for congestion. Negative for ear pain.   Respiratory: Positive for cough and shortness of breath.  Gastrointestinal: Negative for nausea and vomiting.  Genitourinary: Negative for dysuria, frequency and hematuria.  All other systems reviewed and are negative.     Allergies  Cephalexin; Codeine; Demerol; and Sulfa antibiotics  Home Medications   Prior to Admission medications   Medication Sig Start Date End Date Taking? Authorizing Provider  albuterol (PROVENTIL) (2.5 MG/3ML) 0.083% nebulizer solution Take 2.5 mg by nebulization every 6 (six) hours as needed.    Historical Provider, MD  ALPRAZolam Duanne Moron) 1 MG tablet Take 1 mg by mouth 3 (three) times daily as needed.    Historical Provider, MD  clonazePAM (KLONOPIN) 1 MG tablet Take 1 mg by mouth 2 (two) times daily as needed.    Historical Provider, MD  guaiFENesin (MUCINEX) 600 MG 12 hr tablet Take 1,200 mg by mouth 2 (two) times daily.    Historical Provider, MD  omeprazole  (PRILOSEC) 20 MG capsule TAKE 1 CAPSULE BY MOUTH DAILY. TAKE 30 MINUTES BEFORE BREAKFAST DAILY 08/24/12   Orvil Feil, NP  primidone (MYSOLINE) 50 MG tablet One tablet at night for 4 weeks, then take one tablet twice a day 06/28/14   Kathrynn Ducking, MD  ramipril (ALTACE) 5 MG capsule  11/10/11   Historical Provider, MD   There were no vitals taken for this visit. Physical Exam  Nursing note and vitals reviewed. Constitutional: She is oriented to person, place, and time. She appears well-developed and well-nourished.  HENT:  Head: Normocephalic and atraumatic.  Mouth/Throat: Oropharynx is clear and moist. No oropharyngeal exudate.  Eyes: EOM are normal. Pupils are equal, round, and reactive to light.  Cardiovascular: Regular rhythm and normal heart sounds.  Tachycardia present.  Exam reveals no gallop and no friction rub.   No murmur heard. Mild tachycardia  Pulmonary/Chest: Effort normal. No respiratory distress. She has wheezes. She has no rales.  Faint expiratory wheezing; mild tachypnea  Abdominal: Soft. There is no tenderness. There is no rebound and no guarding.  Musculoskeletal: Normal range of motion. She exhibits no edema.  Neurological: She is alert and oriented to person, place, and time.  Mildly tremulous  Skin: Skin is warm and dry. No rash noted.  Psychiatric: She has a normal mood and affect. Her behavior is normal.    ED Course  Procedures   DIAGNOSTIC STUDIES: Oxygen Saturation is 100% on RA, normal by my interpretation.    COORDINATION OF CARE: 3:25 PM Discussed treatment plan with pt at bedside and pt agreed to plan.   Labs Review Labs Reviewed - No data to display  Imaging Review No results found.   EKG Interpretation   Date/Time:  Tuesday September 03 2014 14:51:55 EDT Ventricular Rate:  83 PR Interval:  152 QRS Duration: 78 QT Interval:  368 QTC Calculation: 432 R Axis:   81 Text Interpretation:  Normal sinus rhythm Possible Left atrial enlargement   Septal infarct , age undetermined Abnormal ECG ED PHYSICIAN INTERPRETATION  AVAILABLE IN [facility] Confirmed by TEST, Record ([id number]) on  09/06/2014 7:12:27 AM      MDM   Final diagnoses:  Dyspnea  Excessive sputum   63yF with likely copd exacerbation. Improved some with tx in ED. I feel further tx as outpt appropriate. Steroids and PRN cough meds.   I personally preformed the services scribed in my presence. The recorded information has been reviewed is accurate. Virgel Manifold, MD.     Virgel Manifold, MD 09/12/14 [phone]

## 2014-09-03 NOTE — ED Notes (Signed)
EDP at patient bedside.

## 2014-09-07 ENCOUNTER — Inpatient Hospital Stay (HOSPITAL_COMMUNITY)
Admission: EM | Admit: 2014-09-07 | Discharge: 2014-09-10 | DRG: 192 | Disposition: A | Discharge status: Home / Self Care | Payer: Medicare Other | Attending: Pulmonary Disease | Admitting: Pulmonary Disease

## 2014-09-07 ENCOUNTER — Encounter (HOSPITAL_COMMUNITY): Payer: Self-pay | Admitting: Emergency Medicine

## 2014-09-07 ENCOUNTER — Emergency Department (HOSPITAL_COMMUNITY): Payer: Medicare Other

## 2014-09-07 DIAGNOSIS — R0602 Shortness of breath: Secondary | ICD-10-CM | POA: Diagnosis present

## 2014-09-07 DIAGNOSIS — J441 Chronic obstructive pulmonary disease with (acute) exacerbation: Principal | ICD-10-CM | POA: Diagnosis present

## 2014-09-07 DIAGNOSIS — Z87891 Personal history of nicotine dependence: Secondary | ICD-10-CM

## 2014-09-07 DIAGNOSIS — Z7952 Long term (current) use of systemic steroids: Secondary | ICD-10-CM

## 2014-09-07 DIAGNOSIS — Z8249 Family history of ischemic heart disease and other diseases of the circulatory system: Secondary | ICD-10-CM | POA: Diagnosis not present

## 2014-09-07 DIAGNOSIS — Z79899 Other long term (current) drug therapy: Secondary | ICD-10-CM

## 2014-09-07 DIAGNOSIS — Z823 Family history of stroke: Secondary | ICD-10-CM

## 2014-09-07 DIAGNOSIS — F419 Anxiety disorder, unspecified: Secondary | ICD-10-CM | POA: Diagnosis present

## 2014-09-07 DIAGNOSIS — K219 Gastro-esophageal reflux disease without esophagitis: Secondary | ICD-10-CM | POA: Diagnosis present

## 2014-09-07 DIAGNOSIS — G250 Essential tremor: Secondary | ICD-10-CM | POA: Diagnosis present

## 2014-09-07 DIAGNOSIS — G252 Other specified forms of tremor: Secondary | ICD-10-CM

## 2014-09-07 DIAGNOSIS — I2510 Atherosclerotic heart disease of native coronary artery without angina pectoris: Secondary | ICD-10-CM | POA: Diagnosis present

## 2014-09-07 DIAGNOSIS — I10 Essential (primary) hypertension: Secondary | ICD-10-CM | POA: Diagnosis present

## 2014-09-07 DIAGNOSIS — J449 Chronic obstructive pulmonary disease, unspecified: Secondary | ICD-10-CM

## 2014-09-07 DIAGNOSIS — R634 Abnormal weight loss: Secondary | ICD-10-CM | POA: Diagnosis present

## 2014-09-07 LAB — BLOOD GAS, ARTERIAL
Acid-Base Excess: 0.3 mmol/L (ref 0.0–2.0)
Bicarbonate: 24.4 mEq/L — ABNORMAL HIGH (ref 20.0–24.0)
O2 CONTENT: 2 L/min
O2 SAT: 96.5 %
PCO2 ART: 40 mmHg (ref 35.0–45.0)
PO2 ART: 84.4 mmHg (ref 80.0–100.0)
Patient temperature: 37
TCO2: 21.2 mmol/L (ref 0–100)
pH, Arterial: 7.403 (ref 7.350–7.450)

## 2014-09-07 LAB — BASIC METABOLIC PANEL
Anion gap: 16 — ABNORMAL HIGH (ref 5–15)
BUN: 14 mg/dL (ref 6–23)
CHLORIDE: 98 meq/L (ref 96–112)
CO2: 28 mEq/L (ref 19–32)
Calcium: 10.6 mg/dL — ABNORMAL HIGH (ref 8.4–10.5)
Creatinine, Ser: 0.79 mg/dL (ref 0.50–1.10)
GFR, EST NON AFRICAN AMERICAN: 86 mL/min — AB (ref >90)
GLUCOSE: 108 mg/dL — AB (ref 70–99)
POTASSIUM: 3.8 meq/L (ref 3.7–5.3)
SODIUM: 142 meq/L (ref 137–147)

## 2014-09-07 LAB — CBC WITH DIFFERENTIAL/PLATELET
BASOS PCT: 0 % (ref 0–1)
Basophils Absolute: 0 10*3/uL (ref 0.0–0.1)
EOS ABS: 0 10*3/uL (ref 0.0–0.7)
EOS PCT: 0 % (ref 0–5)
HCT: 47.8 % — ABNORMAL HIGH (ref 36.0–46.0)
Hemoglobin: 16.4 g/dL — ABNORMAL HIGH (ref 12.0–15.0)
LYMPHS ABS: 2.5 10*3/uL (ref 0.7–4.0)
Lymphocytes Relative: 23 % (ref 12–46)
MCH: 31.3 pg (ref 26.0–34.0)
MCHC: 34.3 g/dL (ref 30.0–36.0)
MCV: 91.2 fL (ref 78.0–100.0)
Monocytes Absolute: 1 10*3/uL (ref 0.1–1.0)
Monocytes Relative: 9 % (ref 3–12)
NEUTROS PCT: 68 % (ref 43–77)
Neutro Abs: 7.4 10*3/uL (ref 1.7–7.7)
PLATELETS: 327 10*3/uL (ref 150–400)
RBC: 5.24 MIL/uL — ABNORMAL HIGH (ref 3.87–5.11)
RDW: 13.8 % (ref 11.5–15.5)
WBC: 10.9 10*3/uL — ABNORMAL HIGH (ref 4.0–10.5)

## 2014-09-07 LAB — TROPONIN I

## 2014-09-07 MED ORDER — ENOXAPARIN SODIUM 40 MG/0.4ML ~~LOC~~ SOLN: 40.0000 mg | SUBCUTANEOUS | Status: DC

## 2014-09-07 MED ORDER — DEXTROSE 5 % IV SOLN
500.0000 mg | INTRAVENOUS | Status: DC
  Filled 2014-09-07: qty 500

## 2014-09-07 MED ORDER — NAPROXEN SODIUM 275 MG PO TABS
275.0000 mg | ORAL_TABLET | Freq: Two times a day (BID) | ORAL | Status: DC
  Filled 2014-09-07 (×4): qty 1

## 2014-09-07 MED ORDER — LEVOFLOXACIN IN D5W 500 MG/100ML IV SOLN
500.0000 mg | Freq: Once | INTRAVENOUS | Status: AC
  Administered 2014-09-07: 500 mg via INTRAVENOUS
  Filled 2014-09-07: qty 100

## 2014-09-07 MED ORDER — ENOXAPARIN SODIUM 40 MG/0.4ML ~~LOC~~ SOLN
40.0000 mg | SUBCUTANEOUS | Status: DC
  Administered 2014-09-07: 40 mg via SUBCUTANEOUS
  Filled 2014-09-07: qty 0.4

## 2014-09-07 MED ORDER — ALBUTEROL (5 MG/ML) CONTINUOUS INHALATION SOLN
INHALATION_SOLUTION | RESPIRATORY_TRACT | Status: AC
  Administered 2014-09-07: 10 mg/h
  Filled 2014-09-07: qty 20

## 2014-09-07 MED ORDER — DEXTROSE 5 % IV SOLN
500.0000 mg | INTRAVENOUS | Status: DC
  Administered 2014-09-07 – 2014-09-08 (×2): 500 mg via INTRAVENOUS
  Filled 2014-09-07 (×3): qty 500

## 2014-09-07 MED ORDER — METHYLPREDNISOLONE SODIUM SUCC 125 MG IJ SOLR: 60.0000 mg | Freq: Two times a day (BID) | INTRAMUSCULAR | Status: DC

## 2014-09-07 MED ORDER — DEXTROSE 5 % IV SOLN
INTRAVENOUS | Status: AC
  Filled 2014-09-07: qty 500

## 2014-09-07 MED ORDER — HYDROCOD POLST-CHLORPHEN POLST 10-8 MG/5ML PO LQCR: 5.0000 mL | Freq: Every evening | ORAL | Status: DC | PRN

## 2014-09-07 MED ORDER — ALBUTEROL SULFATE (2.5 MG/3ML) 0.083% IN NEBU: 2.5000 mg | INHALATION_SOLUTION | RESPIRATORY_TRACT | Status: DC | PRN

## 2014-09-07 MED ORDER — SODIUM CHLORIDE 0.9 % IV BOLUS (SEPSIS)
1000.0000 mL | Freq: Once | INTRAVENOUS | Status: AC
  Administered 2014-09-07: 1000 mL via INTRAVENOUS

## 2014-09-07 MED ORDER — GUAIFENESIN-DM 100-10 MG/5ML PO SYRP: 5.0000 mL | ORAL_SOLUTION | ORAL | Status: DC | PRN

## 2014-09-07 MED ORDER — DOCUSATE SODIUM 100 MG PO CAPS
100.0000 mg | ORAL_CAPSULE | Freq: Every day | ORAL | Status: DC | PRN
  Administered 2014-09-09: 100 mg via ORAL
  Filled 2014-09-07 (×2): qty 1

## 2014-09-07 MED ORDER — ALBUTEROL (5 MG/ML) CONTINUOUS INHALATION SOLN: 10.0000 mg/h | INHALATION_SOLUTION | RESPIRATORY_TRACT | Status: DC

## 2014-09-07 MED ORDER — IPRATROPIUM BROMIDE 0.02 % IN SOLN: 0.5000 mg | Freq: Four times a day (QID) | RESPIRATORY_TRACT | Status: DC

## 2014-09-07 MED ORDER — ALUM & MAG HYDROXIDE-SIMETH 200-200-20 MG/5ML PO SUSP: 30.0000 mL | Freq: Four times a day (QID) | ORAL | Status: DC | PRN

## 2014-09-07 MED ORDER — IPRATROPIUM BROMIDE 0.02 % IN SOLN
RESPIRATORY_TRACT | Status: AC
  Administered 2014-09-07: 1 mg
  Filled 2014-09-07: qty 5

## 2014-09-07 MED ORDER — ACETAMINOPHEN 325 MG PO TABS: 650.0000 mg | ORAL_TABLET | Freq: Four times a day (QID) | ORAL | Status: DC | PRN

## 2014-09-07 MED ORDER — CLONAZEPAM 0.5 MG PO TABS
1.0000 mg | ORAL_TABLET | Freq: Two times a day (BID) | ORAL | Status: DC | PRN
  Administered 2014-09-08 – 2014-09-09 (×3): 1 mg via ORAL
  Filled 2014-09-07 (×4): qty 2

## 2014-09-07 MED ORDER — IPRATROPIUM-ALBUTEROL 0.5-2.5 (3) MG/3ML IN SOLN
3.0000 mL | Freq: Four times a day (QID) | RESPIRATORY_TRACT | Status: DC
  Administered 2014-09-07 – 2014-09-09 (×5): 3 mL via RESPIRATORY_TRACT
  Filled 2014-09-07 (×5): qty 3

## 2014-09-07 MED ORDER — METHYLPREDNISOLONE SODIUM SUCC 125 MG IJ SOLR
60.0000 mg | Freq: Two times a day (BID) | INTRAMUSCULAR | Status: DC
  Administered 2014-09-08: 60 mg via INTRAVENOUS
  Filled 2014-09-07: qty 2

## 2014-09-07 MED ORDER — ALBUTEROL SULFATE (2.5 MG/3ML) 0.083% IN NEBU: 2.5000 mg | INHALATION_SOLUTION | Freq: Four times a day (QID) | RESPIRATORY_TRACT | Status: DC

## 2014-09-07 MED ORDER — ALPRAZOLAM 1 MG PO TABS
1.0000 mg | ORAL_TABLET | Freq: Three times a day (TID) | ORAL | Status: DC | PRN
  Administered 2014-09-08 – 2014-09-10 (×3): 1 mg via ORAL
  Filled 2014-09-07 (×4): qty 1

## 2014-09-07 MED ORDER — IPRATROPIUM BROMIDE 0.02 % IN SOLN: 1.0000 mg | Freq: Once | RESPIRATORY_TRACT | Status: DC

## 2014-09-07 MED ORDER — ACETAMINOPHEN 650 MG RE SUPP: 650.0000 mg | Freq: Four times a day (QID) | RECTAL | Status: DC | PRN

## 2014-09-07 MED ORDER — RAMIPRIL 5 MG PO CAPS
5.0000 mg | ORAL_CAPSULE | Freq: Every day | ORAL | Status: DC
  Administered 2014-09-08 – 2014-09-10 (×3): 5 mg via ORAL
  Filled 2014-09-07 (×7): qty 1

## 2014-09-07 MED ORDER — METHYLPREDNISOLONE SODIUM SUCC 125 MG IJ SOLR
125.0000 mg | Freq: Once | INTRAMUSCULAR | Status: AC
  Administered 2014-09-07: 125 mg via INTRAVENOUS
  Filled 2014-09-07: qty 2

## 2014-09-07 NOTE — ED Notes (Signed)
C/O congestion, SOB starting this past Monday and worsening over course of week.  Uses Symbicort and albuterol.  Took last treatment this morning.  Is not currently on home O2.

## 2014-09-07 NOTE — H&P (Signed)
History and Physical  CATHARINE KETTLEWELL HBZ:[id number] DOB: [birth year] DOA: 09/07/2014  Referring physician: Dr. Jeanell Sparrow, ED physician PCP: Alonza Bogus, MD   Chief Complaint: Shortness of breath  HPI: Aimee Rodriguez is a 63 y.o. female  With a history of COPD, hypertension, reflux, anxiety, tremor who presents with 8 days of worsening dyspnea, productive cough with. One sputum. This occurs quite frequently and the patient needs antibiotics and steroids to help with this. She was seen in emergency department on October 27 and was prescribed steroids. This helped some over the past couple of days but she had worsening dyspnea starting this morning with difficulty with raising sputum. She has been using 6 neb vials of albuterol every morning in order to raise sputum, which was ineffective this morning.   Review of Systems:   Pt denies any fevers, chills, nausea, vomiting, chest pain, palpitations, headache, blurred vision.  Review of systems are otherwise negative  Past Medical History  Diagnosis Date  . Degeneration of intervertebral disc   . Cardiac dysrhythmia, unspecified   . Anxiety   . Reflux esophagitis   . HTN (hypertension)   . Chronic bronchitis   . COPD (chronic obstructive pulmonary disease)   . Paroxysmal supraventricular tachycardia     s/p ablation in past  . Essential and other specified forms of tremor 06/28/2014   Past Surgical History  Procedure Laterality Date  . Laparotomy    . Total thyroidectomy    . Vaginal hysterectomy    . Oophorectomy    . Back surgery    . Tonsillectomy    . Foot surgery Right     X2  . Neck surgery    . Polyps of colon    . Cholecystectomy    . Cardiac ablation for paroxysmal supraventricular tachycardia    . Hemorroidectomy    . Esophagogastroduodenoscopy  11/25/2011    Procedure: ESOPHAGOGASTRODUODENOSCOPY (EGD);  Surgeon: Daneil Dolin, MD;  Location: [facility];  Service: Endoscopy;  Laterality: N/A;  12:45   Social  History:  reports that she has quit smoking. Her smoking use included Cigarettes. She smoked 1.00 pack per day. She has never used smokeless tobacco. She reports that she does not drink alcohol or use illicit drugs. Patient lives at home & is able to participate in activities of daily living   Allergies  Allergen Reactions  . Cephalexin Nausea And Vomiting  . Coconut Flavor Hives  . Codeine Nausea And Vomiting  . Demerol Nausea And Vomiting  . Sulfa Antibiotics Hives    Family History  Problem Relation Age of Onset  . Cancer Brother   . Cancer Sister   . Colon cancer Neg Hx   . Lung disease Mother   . Heart disease Mother   . Heart disease Father   . Stroke Father   . Heart attack Brother       Prior to Admission medications   Medication Sig Start Date End Date Taking? Authorizing Provider  albuterol (PROVENTIL) (2.5 MG/3ML) 0.083% nebulizer solution Take 2.5 mg by nebulization every 6 (six) hours as needed.   Yes Historical Provider, MD  ALPRAZolam Duanne Moron) 1 MG tablet Take 1 mg by mouth 3 (three) times daily as needed for anxiety.    Yes Historical Provider, MD  chlorpheniramine-HYDROcodone (TUSSIONEX PENNKINETIC ER) 10-8 MG/5ML LQCR Take 5 mLs by mouth every 12 (twelve) hours as needed for cough. 09/03/14  Yes Virgel Manifold, MD  clonazePAM (KLONOPIN) 1 MG tablet Take 1 mg by  mouth 2 (two) times daily as needed for anxiety.    Yes Historical Provider, MD  guaiFENesin (MUCINEX) 600 MG 12 hr tablet Take 600-1,200 mg by mouth every morning.    Yes Historical Provider, MD  naproxen sodium (ALEVE) 220 MG tablet Take 220 mg by mouth 2 (two) times daily.   Yes Historical Provider, MD  predniSONE (DELTASONE) 20 MG tablet Take 2 tablets (40 mg total) by mouth daily. 09/03/14  Yes Virgel Manifold, MD  ramipril (ALTACE) 5 MG capsule Take 5 mg by mouth daily.  11/10/11  Yes Historical Provider, MD    Physical Exam: BP 117/72  Pulse 114  Resp 28  Wt 40.37 kg (89 lb)  SpO2 97%  General:  Middle-aged Asian female. Awake and alert and oriented x3. No acute cardiopulmonary distress.  Eyes: Pupils equal, round, reactive to light. Extraocular muscles are intact. Sclerae anicteric and noninjected.  ENT: External auditory canals are patent and tympanic membranes reflect a good cone of light. Moist mucosal membranes. No mucosal lesions.  Neck: Neck supple without lymphadenopathy. No carotid bruits. No masses palpated.  Cardiovascular: Regular rate with normal S1-S2 sounds. No murmurs, rubs, gallops auscultated. No JVD.  Respiratory: Mildly diminished breath sounds. Mild wheezing throughout with rhonchi.  Abdomen: Soft, nontender, nondistended. Active bowel sounds. No masses or hepatosplenomegaly  Skin: Dry, warm to touch. 2+ dorsalis pedis and radial pulses. Musculoskeletal: No calf or leg pain. All major joints not erythematous nontender.  Psychiatric: Intact judgment and insight.  Neurologic: No focal neurological deficits. Cranial nerves II through XII are grossly intact.           Labs on Admission:  Basic Metabolic Panel:  Recent Labs Lab 09/03/14 1620 09/07/14 1508  NA 137 142  K 4.3 3.8  CL 95* 98  CO2 28 28  GLUCOSE 85 108*  BUN 15 14  CREATININE 0.77 0.79  CALCIUM 9.9 10.6*   Liver Function Tests: No results found for this basename: AST, ALT, ALKPHOS, BILITOT, PROT, ALBUMIN,  in the last 168 hours No results found for this basename: LIPASE, AMYLASE,  in the last 168 hours No results found for this basename: AMMONIA,  in the last 168 hours CBC:  Recent Labs Lab 09/03/14 1530 09/07/14 1508  WBC 8.5 10.9*  NEUTROABS 5.8 7.4  HGB 15.8* 16.4*  HCT 46.2* 47.8*  MCV 90.6 91.2  PLT 319 327   Cardiac Enzymes:  Recent Labs Lab 09/07/14 1508  TROPONINI <0.30    BNP (last 3 results) No results found for this basename: PROBNP,  in the last 8760 hours CBG: No results found for this basename: GLUCAP,  in the last 168 hours  Radiological Exams on  Admission: Dg Chest Port 1 View  09/07/2014   CLINICAL DATA:  Six day history of progressive difficulty breathing and congestion  EXAM: PORTABLE CHEST - 1 VIEW  COMPARISON:  September 03, 2014  FINDINGS: There is underlying emphysematous change. There is no edema or consolidation. The heart size and pulmonary vascularity are within normal limits. No adenopathy. There is postoperative change in the region of the thyroid. No bone lesions.  IMPRESSION: Underlying emphysematous change.  No edema or consolidation.   Electronically Signed   By: Lowella Grip M.D.   On: 09/07/2014 15:24    EKG: Independently reviewed. Sinus Tachycardia at 115. Normal QRS complexes. No ST changes. Multiple PVCs seen  Assessment/Plan Present on Admission:  . COPD exacerbation  #1 COPD exacerbation - failed outpatient treatment Admit to medical  floor to Dr. Luan Pulling. Although the patient was on BiPAP initially, her breathing status has dramatically improved with steroids, albuterol treatments. She is currently stable on 2 L nasal cannula and therefore and not in need of stepdown unit. Continue with steroids and will start azithromycin as she has having purulent sputum. Continue with oxygen treatment and nebulizer treatments of albuterol and Atrovent.  #2 anxiety Continue home medications  DVT prophylaxis: Lovenox  Consultants: None  Code Status: Full  Family Communication: None   Disposition Plan: Home following utilization  Time spent: 50 minutes  Loma Boston, DO [facility] Pager [phone]

## 2014-09-07 NOTE — ED Notes (Signed)
Hospitalist at bedside 

## 2014-09-07 NOTE — ED Provider Notes (Signed)
CSN: [id number]     Arrival date & time 09/07/14  1455 History   First MD Initiated Contact with Patient 09/07/14 1505     Chief Complaint  Patient presents with  . Shortness of Breath     (Consider location/radiation/quality/duration/timing/severity/associated sxs/prior Treatment) HPI  63 year old female with history of COPD who presents today with worsening cough and dyspnea over 6 days. She was seen here 4 days ago and at that time was started on steroids. She has continued to have worsening dyspnea with intermittently productive cough. She uses an albuterol inhaler but it is unclear from speaking with her if she is using that more frequently. When she does cough she produces a large amount of sputum.  She quit smoking several years ago.  She was last hospitalized several years ago.  She states she does not want to be on a ventilator.   Past Medical History  Diagnosis Date  . Degeneration of intervertebral disc   . Cardiac dysrhythmia, unspecified   . Anxiety   . Reflux esophagitis   . HTN (hypertension)   . Chronic bronchitis   . COPD (chronic obstructive pulmonary disease)   . Paroxysmal supraventricular tachycardia     s/p ablation in past  . Essential and other specified forms of tremor 06/28/2014   Past Surgical History  Procedure Laterality Date  . Laparotomy    . Total thyroidectomy    . Vaginal hysterectomy    . Oophorectomy    . Back surgery    . Tonsillectomy    . Foot surgery Right     X2  . Neck surgery    . Polyps of colon    . Cholecystectomy    . Cardiac ablation for paroxysmal supraventricular tachycardia    . Hemorroidectomy    . Esophagogastroduodenoscopy  11/25/2011    Procedure: ESOPHAGOGASTRODUODENOSCOPY (EGD);  Surgeon: Daneil Dolin, MD;  Location: [facility];  Service: Endoscopy;  Laterality: N/A;  12:45   Family History  Problem Relation Age of Onset  . Cancer Brother   . Cancer Sister   . Colon cancer Neg Hx   . Lung disease Mother    . Heart disease Mother   . Heart disease Father   . Stroke Father   . Heart attack Brother    History  Substance Use Topics  . Smoking status: Former Smoker -- 1.00 packs/day    Types: Cigarettes  . Smokeless tobacco: Never Used     Comment: quit about 3 yrs   . Alcohol Use: No   OB History   Grav Para Term Preterm Abortions TAB SAB Ect Mult Living                 Review of Systems  All other systems reviewed and are negative.     Allergies  Cephalexin; Coconut flavor; Codeine; Demerol; and Sulfa antibiotics  Home Medications   Prior to Admission medications   Medication Sig Start Date End Date Taking? Authorizing Provider  albuterol (PROVENTIL) (2.5 MG/3ML) 0.083% nebulizer solution Take 2.5 mg by nebulization every 6 (six) hours as needed.    Historical Provider, MD  ALPRAZolam Duanne Moron) 1 MG tablet Take 1 mg by mouth 3 (three) times daily as needed for anxiety.     Historical Provider, MD  chlorpheniramine-HYDROcodone (TUSSIONEX PENNKINETIC ER) 10-8 MG/5ML LQCR Take 5 mLs by mouth every 12 (twelve) hours as needed for cough. 09/03/14   Virgel Manifold, MD  clonazePAM (KLONOPIN) 1 MG tablet Take 1 mg by  mouth 2 (two) times daily as needed for anxiety.     Historical Provider, MD  guaiFENesin (MUCINEX) 600 MG 12 hr tablet Take 600-1,200 mg by mouth every morning.     Historical Provider, MD  naproxen sodium (ALEVE) 220 MG tablet Take 220 mg by mouth 2 (two) times daily.    Historical Provider, MD  predniSONE (DELTASONE) 20 MG tablet Take 2 tablets (40 mg total) by mouth daily. 09/03/14   Virgel Manifold, MD  ramipril (ALTACE) 5 MG capsule Take 5 mg by mouth daily.  11/10/11   Historical Provider, MD   BP 177/131  Pulse 123  Resp 63  Wt 89 lb (40.37 kg)  SpO2 94% Physical Exam  Nursing note and vitals reviewed. Constitutional: She is oriented to person, place, and time. She appears well-developed and well-nourished.  HENT:  Head: Normocephalic and atraumatic.  Right Ear:  External ear normal.  Left Ear: External ear normal.  Nose: Nose normal.  Mouth/Throat: Oropharynx is clear and moist.  Eyes: Conjunctivae and EOM are normal. Pupils are equal, round, and reactive to light.  Neck: Normal range of motion. Neck supple. No JVD present. No tracheal deviation present. No thyromegaly present.  Cardiovascular: Normal heart sounds and intact distal pulses.  Tachycardia present.   Pulmonary/Chest: Accessory muscle usage present. Tachypnea noted. She is in respiratory distress. She has decreased breath sounds. She has wheezes. She has rhonchi. She has no rales.  Abdominal: Soft. Bowel sounds are normal. She exhibits no mass. There is no tenderness. There is no guarding.  Musculoskeletal: Normal range of motion.  Lymphadenopathy:    She has no cervical adenopathy.  Neurological: She is alert and oriented to person, place, and time. She has normal reflexes. No cranial nerve deficit or sensory deficit. Gait normal. GCS eye subscore is 4. GCS verbal subscore is 5. GCS motor subscore is 6.  Reflex Scores:      Bicep reflexes are 2+ on the right side and 2+ on the left side.      Patellar reflexes are 2+ on the right side and 2+ on the left side. Strength is 5/5 bilateral elbow flexor/extensors, wrist extension/flexion, intrinsic hand strength equal Bilateral hip flexion/extension 5/5, knee flexion/extension 5/5, ankle 5/5 flexion extension    Skin: Skin is warm and dry.  Psychiatric: She has a normal mood and affect. Her behavior is normal. Judgment and thought content normal.    ED Course  Procedures (including critical care time) Labs Review Labs Reviewed  CBC WITH DIFFERENTIAL - Abnormal; Notable for the following:    WBC 10.9 (*)    RBC 5.24 (*)    Hemoglobin 16.4 (*)    HCT 47.8 (*)    All other components within normal limits  BASIC METABOLIC PANEL  TROPONIN I    Imaging Review Dg Chest Port 1 View  09/07/2014   CLINICAL DATA:  Six day history of  progressive difficulty breathing and congestion  EXAM: PORTABLE CHEST - 1 VIEW  COMPARISON:  September 03, 2014  FINDINGS: There is underlying emphysematous change. There is no edema or consolidation. The heart size and pulmonary vascularity are within normal limits. No adenopathy. There is postoperative change in the region of the thyroid. No bone lesions.  IMPRESSION: Underlying emphysematous change.  No edema or consolidation.   Electronically Signed   By: Lowella Grip M.D.   On: 09/07/2014 15:24     EKG Interpretation   Date/Time:  Saturday September 07 2014 15:07:12 EDT Ventricular Rate:  115 PR Interval:  173 QRS Duration: 78 QT Interval:  363 QTC Calculation: 502 R Axis:   93 Text Interpretation:  Sinus tachycardia Non-specific ST-t changes  Confirmed by Sakiya Stepka MD, Andee Poles ([id number]) on 09/07/2014 7:37:06 PM      MDM   Final diagnoses:  COPD (chronic obstructive pulmonary disease)    4:27 PM Recheck patient tolerating bipap well with increased bs, decreased wheezing and decreased wob.  Plan reassess after one hour neb.   5:14 PM Patient continues with sats 100% on neb/via oxygen.  HR 110, good bp but continues with increased wob and diffuse rhonchi.  Patient had bipap discontinued after first hour as she is coughing up frequent large amounts of sputum and did not tolerate bipap well.  She appears to be improving.   CRITICAL CARE Performed by: Shaune Pollack Total critical care time: 60 Critical care time was exclusive of separately billable procedures and treating other patients. Critical care was necessary to treat or prevent imminent or life-threatening deterioration. Critical care was time spent personally by me on the following activities: development of treatment plan with patient and/or surrogate as well as nursing, discussions with consultants, evaluation of patient's response to treatment, examination of patient, obtaining history from patient or surrogate, ordering and  performing treatments and interventions, ordering and review of laboratory studies, ordering and review of radiographic studies, pulse oximetry and re-evaluation of patient's condition.   Shaune Pollack, MD 09/07/14 [phone]

## 2014-09-07 NOTE — ED Notes (Signed)
PT c/o worsening in SOB over the past 5 days. PT shallow with increased respirations on exam. PT on continuous neb at this time and respiratory at bedside placing pt on bipap.

## 2014-09-07 NOTE — ED Notes (Signed)
Report given to [facility] for room 329.

## 2014-09-07 NOTE — ED Notes (Signed)
Respiratory paged for blood gas to be obtained.

## 2014-09-08 LAB — CBC
HCT: 42 % (ref 36.0–46.0)
Hemoglobin: 13.7 g/dL (ref 12.0–15.0)
MCH: 30.1 pg (ref 26.0–34.0)
MCHC: 32.6 g/dL (ref 30.0–36.0)
MCV: 92.3 fL (ref 78.0–100.0)
PLATELETS: 289 10*3/uL (ref 150–400)
RBC: 4.55 MIL/uL (ref 3.87–5.11)
RDW: 14.1 % (ref 11.5–15.5)
WBC: 8.2 10*3/uL (ref 4.0–10.5)

## 2014-09-08 LAB — BASIC METABOLIC PANEL
ANION GAP: 11 (ref 5–15)
BUN: 12 mg/dL (ref 6–23)
CALCIUM: 9.3 mg/dL (ref 8.4–10.5)
CO2: 28 mEq/L (ref 19–32)
Chloride: 102 mEq/L (ref 96–112)
Creatinine, Ser: 0.82 mg/dL (ref 0.50–1.10)
GFR calc non Af Amer: 74 mL/min — ABNORMAL LOW (ref >90)
GFR, EST AFRICAN AMERICAN: 86 mL/min — AB (ref >90)
Glucose, Bld: 100 mg/dL — ABNORMAL HIGH (ref 70–99)
Potassium: 3.8 mEq/L (ref 3.7–5.3)
SODIUM: 141 meq/L (ref 137–147)

## 2014-09-08 MED ORDER — NAPROXEN 250 MG PO TABS
250.0000 mg | ORAL_TABLET | Freq: Two times a day (BID) | ORAL | Status: DC
Start: 2014-09-08 — End: 2014-09-10
  Administered 2014-09-08 – 2014-09-10 (×5): 250 mg via ORAL
  Filled 2014-09-08 (×5): qty 1

## 2014-09-08 MED ORDER — GUAIFENESIN ER 600 MG PO TB12
1200.0000 mg | ORAL_TABLET | Freq: Two times a day (BID) | ORAL | Status: DC
  Administered 2014-09-08 – 2014-09-10 (×5): 1200 mg via ORAL
  Filled 2014-09-08 (×6): qty 2

## 2014-09-08 MED ORDER — METHYLPREDNISOLONE SODIUM SUCC 125 MG IJ SOLR
60.0000 mg | Freq: Four times a day (QID) | INTRAMUSCULAR | Status: DC
  Administered 2014-09-08 – 2014-09-10 (×8): 60 mg via INTRAVENOUS
  Filled 2014-09-08 (×8): qty 2

## 2014-09-08 MED ORDER — ENOXAPARIN SODIUM 30 MG/0.3ML ~~LOC~~ SOLN
30.0000 mg | SUBCUTANEOUS | Status: DC
  Administered 2014-09-08: 30 mg via SUBCUTANEOUS
  Filled 2014-09-08: qty 0.3

## 2014-09-08 NOTE — Plan of Care (Signed)
Problem: ICU Phase Progression Outcomes Goal: O2 sats trending toward baseline Outcome: Completed/Met Date Met:  09/08/14 Goal: Dyspnea controlled at rest Outcome: Completed/Met Date Met:  09/08/14 Goal: Voiding-avoid urinary catheter unless indicated Outcome: Completed/Met Date Met:  09/08/14

## 2014-09-08 NOTE — Plan of Care (Signed)
Problem: ICU Phase Progression Outcomes Goal: Initial discharge plan identified Outcome: Progressing

## 2014-09-08 NOTE — Progress Notes (Signed)
Subjective: She was admitted yesterday with COPD exacerbation. She has continued to cough up a lot of sputum. She has very limited understanding of her problem so we spent a good bit of time today going over COPD the symptoms and the effects on her breathing.  Objective: Vital signs in last 24 hours: Temp:  [98.5 F (36.9 C)-98.8 F (37.1 C)] 98.5 F (36.9 C) (11/01 0628) Pulse Rate:  [67-123] 67 (11/01 0628) Resp:  [22-63] 22 (11/01 0628) BP: (109-180)/(65-131) 143/77 mmHg (11/01 0628) SpO2:  [93 %-100 %] 96 % (11/01 0813) Weight:  [39.8 kg (87 lb 11.9 oz)-40.37 kg (89 lb)] 39.8 kg (87 lb 11.9 oz) (11/01 7017) Weight change:  Last BM Date: 09/06/14  Intake/Output from previous day: 10/31 0701 - 11/01 0700 In: 250 [IV Piggyback:250] Out: -   PHYSICAL EXAM General appearance: alert, cooperative and mild distress Resp: rhonchi bilaterally Cardio: regular rate and rhythm, S1, S2 normal, no murmur, click, rub or gallop GI: soft, non-tender; bowel sounds normal; no masses,  no organomegaly Extremities: extremities normal, atraumatic, no cyanosis or edema  Lab Results:  Results for orders placed or performed during the hospital encounter of 09/07/14 (from the past 48 hour(s))  CBC with Differential     Status: Abnormal   Collection Time: 09/07/14  3:08 PM  Result Value Ref Range   WBC 10.9 (H) 4.0 - 10.5 K/uL   RBC 5.24 (H) 3.87 - 5.11 MIL/uL   Hemoglobin 16.4 (H) 12.0 - 15.0 g/dL   HCT 47.8 (H) 36.0 - 46.0 %   MCV 91.2 78.0 - 100.0 fL   MCH 31.3 26.0 - 34.0 pg   MCHC 34.3 30.0 - 36.0 g/dL   RDW 13.8 11.5 - 15.5 %   Platelets 327 150 - 400 K/uL   Neutrophils Relative % 68 43 - 77 %   Neutro Abs 7.4 1.7 - 7.7 K/uL   Lymphocytes Relative 23 12 - 46 %   Lymphs Abs 2.5 0.7 - 4.0 K/uL   Monocytes Relative 9 3 - 12 %   Monocytes Absolute 1.0 0.1 - 1.0 K/uL   Eosinophils Relative 0 0 - 5 %   Eosinophils Absolute 0.0 0.0 - 0.7 K/uL   Basophils Relative 0 0 - 1 %   Basophils  Absolute 0.0 0.0 - 0.1 K/uL  Basic metabolic panel     Status: Abnormal   Collection Time: 09/07/14  3:08 PM  Result Value Ref Range   Sodium 142 137 - 147 mEq/Rodriguez   Potassium 3.8 3.7 - 5.3 mEq/Rodriguez   Chloride 98 96 - 112 mEq/Rodriguez   CO2 28 19 - 32 mEq/Rodriguez   Glucose, Bld 108 (H) 70 - 99 mg/dL   BUN 14 6 - 23 mg/dL   Creatinine, Ser 0.79 0.50 - 1.10 mg/dL   Calcium 10.6 (H) 8.4 - 10.5 mg/dL   GFR calc non Af Amer 86 (Rodriguez) >90 mL/min   GFR calc Af Amer >90 >90 mL/min    Comment: (NOTE) The eGFR has been calculated using the CKD EPI equation. This calculation has not been validated in all clinical situations. eGFR's persistently <90 mL/min signify possible Chronic Kidney Disease.   Anion gap 16 (H) 5 - 15  Troponin I     Status: None   Collection Time: 09/07/14  3:08 PM  Result Value Ref Range   Troponin I <0.30 <0.30 ng/mL    Comment:        Due to the release kinetics of cTnI, a  negative result within the first hours of the onset of symptoms does not rule out myocardial infarction with certainty. If myocardial infarction is still suspected, repeat the test at appropriate intervals.  Blood gas, arterial ([facility] ONLY)     Status: Abnormal   Collection Time: 09/07/14  5:35 PM  Result Value Ref Range   O2 Content 2.0 Rodriguez/min   Delivery systems NASAL CANNULA    pH, Arterial 7.403 7.350 - 7.450   pCO2 arterial 40.0 35.0 - 45.0 mmHg   pO2, Arterial 84.4 80.0 - 100.0 mmHg   Bicarbonate 24.4 (H) 20.0 - 24.0 mEq/Rodriguez   TCO2 21.2 0 - 100 mmol/Rodriguez   Acid-Base Excess 0.3 0.0 - 2.0 mmol/Rodriguez   O2 Saturation 96.5 %   Patient temperature 37.0    Collection site RIGHT RADIAL    Drawn by COLLECTED BY RT    Sample type ARTERIAL    Allens test (pass/fail) PASS PASS  CBC     Status: None   Collection Time: 09/08/14  6:01 AM  Result Value Ref Range   WBC 8.2 4.0 - 10.5 K/uL   RBC 4.55 3.87 - 5.11 MIL/uL   Hemoglobin 13.7 12.0 - 15.0 g/dL   HCT 42.0 36.0 - 46.0 %   MCV 92.3 78.0 - 100.0 fL   MCH 30.1 26.0  - 34.0 pg   MCHC 32.6 30.0 - 36.0 g/dL   RDW 14.1 11.5 - 15.5 %   Platelets 289 150 - 400 K/uL  Basic metabolic panel     Status: Abnormal   Collection Time: 09/08/14  6:01 AM  Result Value Ref Range   Sodium 141 137 - 147 mEq/Rodriguez   Potassium 3.8 3.7 - 5.3 mEq/Rodriguez   Chloride 102 96 - 112 mEq/Rodriguez   CO2 28 19 - 32 mEq/Rodriguez   Glucose, Bld 100 (H) 70 - 99 mg/dL   BUN 12 6 - 23 mg/dL   Creatinine, Ser 0.82 0.50 - 1.10 mg/dL   Calcium 9.3 8.4 - 10.5 mg/dL   GFR calc non Af Amer 74 (Rodriguez) >90 mL/min   GFR calc Af Amer 86 (Rodriguez) >90 mL/min    Comment: (NOTE) The eGFR has been calculated using the CKD EPI equation. This calculation has not been validated in all clinical situations. eGFR's persistently <90 mL/min signify possible Chronic Kidney Disease.    Anion gap 11 5 - 15    ABGS  Recent Labs  09/07/14 1735  PHART 7.403  PO2ART 84.4  TCO2 21.2  HCO3 24.4*   CULTURES No results found for this or any previous visit (from the past 240 hour(s)). Studies/Results: Dg Chest Port 1 View  09/07/2014   CLINICAL DATA:  Six day history of progressive difficulty breathing and congestion  EXAM: PORTABLE CHEST - 1 VIEW  COMPARISON:  September 03, 2014  FINDINGS: There is underlying emphysematous change. There is no edema or consolidation. The heart size and pulmonary vascularity are within normal limits. No adenopathy. There is postoperative change in the region of the thyroid. No bone lesions.  IMPRESSION: Underlying emphysematous change.  No edema or consolidation.   Electronically Signed   By: Lowella Grip M.D.   On: 09/07/2014 15:24    Medications:  Prior to Admission:  Prescriptions prior to admission  Medication Sig Dispense Refill Last Dose  . albuterol (PROVENTIL) (2.5 MG/3ML) 0.083% nebulizer solution Take 2.5 mg by nebulization every 6 (six) hours as needed.   09/07/2014 at Unknown time  . ALPRAZolam Duanne Moron) 1  MG tablet Take 1 mg by mouth 3 (three) times daily as needed for anxiety.     09/07/2014 at Unknown time  . chlorpheniramine-HYDROcodone (TUSSIONEX PENNKINETIC ER) 10-8 MG/5ML LQCR Take 5 mLs by mouth every 12 (twelve) hours as needed for cough. 115 mL 0 Past Week at Unknown time  . clonazePAM (KLONOPIN) 1 MG tablet Take 1 mg by mouth 2 (two) times daily as needed for anxiety.    09/07/2014 at Unknown time  . guaiFENesin (MUCINEX) 600 MG 12 hr tablet Take 600-1,200 mg by mouth every morning.    09/07/2014 at Unknown time  . naproxen sodium (ALEVE) 220 MG tablet Take 220 mg by mouth 2 (two) times daily.   Past Week at Unknown time  . predniSONE (DELTASONE) 20 MG tablet Take 2 tablets (40 mg total) by mouth daily. 8 tablet 0 Past Week at Unknown time  . ramipril (ALTACE) 5 MG capsule Take 5 mg by mouth daily.    Past Week at Unknown time   Scheduled: . azithromycin  500 mg Intravenous Q24H  . enoxaparin (LOVENOX) injection  30 mg Subcutaneous Q24H  . guaiFENesin  1,200 mg Oral BID  . ipratropium  1 mg Nebulization Once  . ipratropium-albuterol  3 mL Nebulization Q6H  . methylPREDNISolone (SOLU-MEDROL) injection  60 mg Intravenous Q6H  . naproxen  250 mg Oral BID WC  . ramipril  5 mg Oral Daily   Continuous: . albuterol     KRE:[id number] **OR** acetaminophen, albuterol, ALPRAZolam, alum & mag hydroxide-simeth, chlorpheniramine-HYDROcodone, clonazePAM, docusate sodium  Assesment:she has COPD exacerbation and I think she is better but certainly not clear. She has had chronic tremor of her head and that seems a little better. She has lost a significant amount of weight and I think she has at least some element of malnutrition.  Active Problems:   COPD exacerbation    Plan:because she is having trouble clearing her sputum I added a flutter valve and Mucinex. Increase her steroid. Continue with Zithromax.    LOS: 1 day   Aimee Rodriguez 09/08/2014, 9:40 AM

## 2014-09-08 NOTE — Plan of Care (Signed)
Problem: ICU Phase Progression Outcomes Goal: Flu/PneumoVaccines if indicated Outcome: Not Applicable Date Met:  03/10/55

## 2014-09-08 NOTE — Plan of Care (Signed)
Problem: ICU Phase Progression Outcomes Goal: Hemodynamically stable Outcome: Completed/Met Date Met:  09/08/14 Goal: Pain controlled with appropriate interventions Outcome: Completed/Met Date Met:  09/08/14

## 2014-09-09 MED ORDER — AZITHROMYCIN 250 MG PO TABS
500.0000 mg | ORAL_TABLET | Freq: Every day | ORAL | Status: DC
  Administered 2014-09-09: 500 mg via ORAL
  Filled 2014-09-09: qty 2

## 2014-09-09 MED ORDER — BISACODYL 10 MG RE SUPP
10.0000 mg | Freq: Every day | RECTAL | Status: DC | PRN
  Administered 2014-09-09: 10 mg via RECTAL
  Filled 2014-09-09: qty 1

## 2014-09-09 MED ORDER — ACETYLCYSTEINE 20 % IN SOLN
3.0000 mL | RESPIRATORY_TRACT | Status: DC
  Administered 2014-09-09 (×2): 4 mL via RESPIRATORY_TRACT
  Administered 2014-09-09 – 2014-09-10 (×4): 3 mL via RESPIRATORY_TRACT
  Filled 2014-09-09 (×6): qty 4

## 2014-09-09 MED ORDER — IPRATROPIUM-ALBUTEROL 0.5-2.5 (3) MG/3ML IN SOLN
3.0000 mL | RESPIRATORY_TRACT | Status: DC
Start: 2014-09-09 — End: 2014-09-10
  Administered 2014-09-09 – 2014-09-10 (×6): 3 mL via RESPIRATORY_TRACT
  Filled 2014-09-09 (×6): qty 3

## 2014-09-09 MED ORDER — MAGNESIUM HYDROXIDE 400 MG/5ML PO SUSP: 30.0000 mL | Freq: Every day | ORAL | Status: DC | PRN

## 2014-09-09 NOTE — Plan of Care (Signed)
Problem: Phase I Progression Outcomes Goal: Tolerating diet Outcome: Completed/Met Date Met:  09/09/14     

## 2014-09-09 NOTE — Plan of Care (Signed)
Problem: Phase I Progression Outcomes Goal: Progress activity as tolerated unless otherwise ordered Outcome: Progressing

## 2014-09-09 NOTE — Plan of Care (Signed)
Problem: Consults Goal: Diabetes Guidelines if Diabetic/Glucose > 140 If diabetic or lab glucose is > 140 mg/dl - Initiate Diabetes/Hyperglycemia Guidelines & Document Interventions  Outcome: Not Applicable Date Met:  09/09/14     

## 2014-09-09 NOTE — Progress Notes (Signed)
PHARMACIST - PHYSICIAN COMMUNICATION DR:   Hawkins CONCERNING: Antibiotic IV to Oral Route Change Policy  RECOMMENDATION: This patient is receiving Zithromax by the intravenous route.  Based on criteria approved by the Pharmacy and Therapeutics Committee, the antibiotic(s) is/are being converted to the equivalent oral dose form(s).   DESCRIPTION: These criteria include:  Patient being treated for a respiratory tract infection, urinary tract infection, cellulitis or clostridium difficile associated diarrhea if on metronidazole  The patient is not neutropenic and does not exhibit a GI malabsorption state  The patient is eating (either orally or via tube) and/or has been taking other orally administered medications for a least 24 hours  The patient is improving clinically and has a Tmax < 100.5  If you have questions about this conversion, please contact the Pharmacy Department  [x]  ( [phone] )  [place] []  ( [phone] )  [place]  []  ( [phone] )  [facility] []  ( [phone] )  [facility]   S. Promiss Labarbera, PharmD  

## 2014-09-09 NOTE — Progress Notes (Signed)
Patient had small hard stool earlier today

## 2014-09-09 NOTE — Care Management Utilization Note (Signed)
UR completed 

## 2014-09-09 NOTE — Plan of Care (Signed)
Problem: Phase I Progression Outcomes Goal: Hemodynamically stable Outcome: Progressing     

## 2014-09-09 NOTE — Progress Notes (Signed)
Patient c/o feeling nervous earlier medicated with Xanax po

## 2014-09-09 NOTE — Progress Notes (Signed)
Subjective: She says she feels about the same. She is still having trouble coughing up sputum. She is still short of breath at rest  Objective: Vital signs in last 24 hours: Temp:  [97.6 F (36.4 C)] 97.6 F (36.4 C) (11/02 0449) Pulse Rate:  [57-64] 57 (11/02 0449) Resp:  [20-21] 21 (11/02 0449) BP: (123-144)/(66-75) 144/75 mmHg (11/02 0449) SpO2:  [95 %-100 %] 98 % (11/02 0715) Weight change:  Last BM Date: 09/07/14  Intake/Output from previous day: 11/01 0701 - 11/02 0700 In: 720 [P.O.:720] Out: -   PHYSICAL EXAM General appearance: alert, cooperative and mild distress Resp: rhonchi bilaterally Cardio: regular rate and rhythm, S1, S2 normal, no murmur, click, rub or gallop GI: soft, non-tender; bowel sounds normal; no masses,  no organomegaly Extremities: extremities normal, atraumatic, no cyanosis or edema  Lab Results:  Results for orders placed or performed during the hospital encounter of 09/07/14 (from the past 48 hour(s))  CBC with Differential     Status: Abnormal   Collection Time: 09/07/14  3:08 PM  Result Value Ref Range   WBC 10.9 (H) 4.0 - 10.5 K/uL   RBC 5.24 (H) 3.87 - 5.11 MIL/uL   Hemoglobin 16.4 (H) 12.0 - 15.0 g/dL   HCT 47.8 (H) 36.0 - 46.0 %   MCV 91.2 78.0 - 100.0 fL   MCH 31.3 26.0 - 34.0 pg   MCHC 34.3 30.0 - 36.0 g/dL   RDW 13.8 11.5 - 15.5 %   Platelets 327 150 - 400 K/uL   Neutrophils Relative % 68 43 - 77 %   Neutro Abs 7.4 1.7 - 7.7 K/uL   Lymphocytes Relative 23 12 - 46 %   Lymphs Abs 2.5 0.7 - 4.0 K/uL   Monocytes Relative 9 3 - 12 %   Monocytes Absolute 1.0 0.1 - 1.0 K/uL   Eosinophils Relative 0 0 - 5 %   Eosinophils Absolute 0.0 0.0 - 0.7 K/uL   Basophils Relative 0 0 - 1 %   Basophils Absolute 0.0 0.0 - 0.1 K/uL  Basic metabolic panel     Status: Abnormal   Collection Time: 09/07/14  3:08 PM  Result Value Ref Range   Sodium 142 137 - 147 mEq/L   Potassium 3.8 3.7 - 5.3 mEq/L   Chloride 98 96 - 112 mEq/L   CO2 28 19 - 32  mEq/L   Glucose, Bld 108 (H) 70 - 99 mg/dL   BUN 14 6 - 23 mg/dL   Creatinine, Ser 0.79 0.50 - 1.10 mg/dL   Calcium 10.6 (H) 8.4 - 10.5 mg/dL   GFR calc non Af Amer 86 (L) >90 mL/min   GFR calc Af Amer >90 >90 mL/min    Comment: (NOTE) The eGFR has been calculated using the CKD EPI equation. This calculation has not been validated in all clinical situations. eGFR's persistently <90 mL/min signify possible Chronic Kidney Disease.   Anion gap 16 (H) 5 - 15  Troponin I     Status: None   Collection Time: 09/07/14  3:08 PM  Result Value Ref Range   Troponin I <0.30 <0.30 ng/mL    Comment:        Due to the release kinetics of cTnI, a negative result within the first hours of the onset of symptoms does not rule out myocardial infarction with certainty. If myocardial infarction is still suspected, repeat the test at appropriate intervals.  Blood gas, arterial ([facility] & [facility] ONLY)     Status: Abnormal  Collection Time: 09/07/14  5:35 PM  Result Value Ref Range   O2 Content 2.0 L/min   Delivery systems NASAL CANNULA    pH, Arterial 7.403 7.350 - 7.450   pCO2 arterial 40.0 35.0 - 45.0 mmHg   pO2, Arterial 84.4 80.0 - 100.0 mmHg   Bicarbonate 24.4 (H) 20.0 - 24.0 mEq/L   TCO2 21.2 0 - 100 mmol/L   Acid-Base Excess 0.3 0.0 - 2.0 mmol/L   O2 Saturation 96.5 %   Patient temperature 37.0    Collection site RIGHT RADIAL    Drawn by COLLECTED BY RT    Sample type ARTERIAL    Allens test (pass/fail) PASS PASS  CBC     Status: None   Collection Time: 09/08/14  6:01 AM  Result Value Ref Range   WBC 8.2 4.0 - 10.5 K/uL   RBC 4.55 3.87 - 5.11 MIL/uL   Hemoglobin 13.7 12.0 - 15.0 g/dL   HCT 42.0 36.0 - 46.0 %   MCV 92.3 78.0 - 100.0 fL   MCH 30.1 26.0 - 34.0 pg   MCHC 32.6 30.0 - 36.0 g/dL   RDW 14.1 11.5 - 15.5 %   Platelets 289 150 - 400 K/uL  Basic metabolic panel     Status: Abnormal   Collection Time: 09/08/14  6:01 AM  Result Value Ref Range   Sodium 141 137 - 147 mEq/L    Potassium 3.8 3.7 - 5.3 mEq/L   Chloride 102 96 - 112 mEq/L   CO2 28 19 - 32 mEq/L   Glucose, Bld 100 (H) 70 - 99 mg/dL   BUN 12 6 - 23 mg/dL   Creatinine, Ser 0.82 0.50 - 1.10 mg/dL   Calcium 9.3 8.4 - 10.5 mg/dL   GFR calc non Af Amer 74 (L) >90 mL/min   GFR calc Af Amer 86 (L) >90 mL/min    Comment: (NOTE) The eGFR has been calculated using the CKD EPI equation. This calculation has not been validated in all clinical situations. eGFR's persistently <90 mL/min signify possible Chronic Kidney Disease.    Anion gap 11 5 - 15    ABGS  Recent Labs  09/07/14 1735  PHART 7.403  PO2ART 84.4  TCO2 21.2  HCO3 24.4*   CULTURES No results found for this or any previous visit (from the past 240 hour(s)). Studies/Results: Dg Chest Port 1 View  09/07/2014   CLINICAL DATA:  Six day history of progressive difficulty breathing and congestion  EXAM: PORTABLE CHEST - 1 VIEW  COMPARISON:  September 03, 2014  FINDINGS: There is underlying emphysematous change. There is no edema or consolidation. The heart size and pulmonary vascularity are within normal limits. No adenopathy. There is postoperative change in the region of the thyroid. No bone lesions.  IMPRESSION: Underlying emphysematous change.  No edema or consolidation.   Electronically Signed   By: Lowella Grip M.D.   On: 09/07/2014 15:24    Medications:  Prior to Admission:  Prescriptions prior to admission  Medication Sig Dispense Refill Last Dose  . albuterol (PROVENTIL) (2.5 MG/3ML) 0.083% nebulizer solution Take 2.5 mg by nebulization every 6 (six) hours as needed.   09/07/2014 at Unknown time  . ALPRAZolam (XANAX) 1 MG tablet Take 1 mg by mouth 3 (three) times daily as needed for anxiety.    09/07/2014 at Unknown time  . chlorpheniramine-HYDROcodone (TUSSIONEX PENNKINETIC ER) 10-8 MG/5ML LQCR Take 5 mLs by mouth every 12 (twelve) hours as needed for cough. 115 mL 0  Past Week at Unknown time  . clonazePAM (KLONOPIN) 1 MG tablet  Take 1 mg by mouth 2 (two) times daily as needed for anxiety.    09/07/2014 at Unknown time  . guaiFENesin (MUCINEX) 600 MG 12 hr tablet Take 600-1,200 mg by mouth every morning.    09/07/2014 at Unknown time  . naproxen sodium (ALEVE) 220 MG tablet Take 220 mg by mouth 2 (two) times daily.   Past Week at Unknown time  . predniSONE (DELTASONE) 20 MG tablet Take 2 tablets (40 mg total) by mouth daily. 8 tablet 0 Past Week at Unknown time  . ramipril (ALTACE) 5 MG capsule Take 5 mg by mouth daily.    Past Week at Unknown time   Scheduled: . acetylcysteine  3 mL Nebulization Q4H  . azithromycin  500 mg Intravenous Q24H  . enoxaparin (LOVENOX) injection  30 mg Subcutaneous Q24H  . guaiFENesin  1,200 mg Oral BID  . ipratropium-albuterol  3 mL Nebulization Q4H  . methylPREDNISolone (SOLU-MEDROL) injection  60 mg Intravenous Q6H  . naproxen  250 mg Oral BID WC  . ramipril  5 mg Oral Daily   Continuous:  OFP:[id number] **OR** acetaminophen, albuterol, ALPRAZolam, alum & mag hydroxide-simeth, chlorpheniramine-HYDROcodone, clonazePAM, docusate sodium  Assesment:she was admitted with COPD exacerbation. She is still having significant difficulty coughing up sputum she has no other new complaints. She still has tremor and wonders if it's related to albuterol since she overused her albuterol. However she's been having tremor for about 2 years so I think it's unlikely that that is related  Active Problems:   COPD exacerbation    Plan:continue current treatments add Mucomyst.    LOS: 2 days   Shamya Macfadden L 09/09/2014, 8:27 AM

## 2014-09-10 MED ORDER — IPRATROPIUM-ALBUTEROL 0.5-2.5 (3) MG/3ML IN SOLN: 3.0000 mL | RESPIRATORY_TRACT | Status: AC

## 2014-09-10 MED ORDER — LEVOFLOXACIN 500 MG PO TABS: 500.0000 mg | ORAL_TABLET | Freq: Every day | ORAL | Status: DC

## 2014-09-10 MED ORDER — PREDNISONE 10 MG PO TABS: 20.0000 mg | ORAL_TABLET | Freq: Every day | ORAL | Status: DC

## 2014-09-10 NOTE — Care Management Note (Signed)
    Page 1 of 1   09/10/2014     11:01:58 AM CARE MANAGEMENT NOTE 09/10/2014  Patient:  Aimee Rodriguez, Aimee Rodriguez   Account Number:  [id number]  Date Initiated:  09/09/2014  Documentation initiated by:  Vladimir Creeks  Subjective/Objective Assessment:   Admitted with COPD. Pt is from home with spouse, is independent, and will return home at D/C. She refuses HH. Says she does not nned this, and she is not home bound, so does not qualify.     Action/Plan:   No needs identified   Anticipated DC Date:  09/10/2014   Anticipated DC Plan:  [place]  CM consult      Choice offered to / List presented to:             Status of service:  Completed, signed off Medicare Important Message given?  YES (If response is "NO", the following Medicare IM given date fields will be blank) Date Medicare IM given:  09/10/2014 Medicare IM given by:  Vladimir Creeks Date Additional Medicare IM given:   Additional Medicare IM given by:    Discharge Disposition:  HOME/SELF CARE  Per UR Regulation:  Reviewed for med. necessity/level of care/duration of stay  If discussed at [place] of Stay Meetings, dates discussed:    Comments:  09/10/14 [place] RN/CM

## 2014-09-10 NOTE — Progress Notes (Signed)
Upper body tremors noted increase tremors with coughing. Xanax given.

## 2014-09-10 NOTE — Progress Notes (Signed)
Subjective: She feels better and says she wants to go home. She has no new complaints.  Objective: Vital signs in last 24 hours: Temp:  [97.8 F (36.6 C)-97.9 F (36.6 C)] 97.9 F (36.6 C) (11/03 0544) Pulse Rate:  [67-84] 67 (11/03 0544) Resp:  [20] 20 (11/03 0544) BP: (130-170)/(68-97) 164/80 mmHg (11/03 0651) SpO2:  [91 %-95 %] 93 % (11/03 0712) Weight change:  Last BM Date: 09/09/14  Intake/Output from previous day: 11/02 0701 - 11/03 0700 In: 720 [P.O.:720] Out: -   PHYSICAL EXAM General appearance: alert, cooperative and no distress Resp: rhonchi bilaterally Cardio: regular rate and rhythm, S1, S2 normal, no murmur, click, rub or gallop GI: soft, non-tender; bowel sounds normal; no masses,  no organomegaly Extremities: extremities normal, atraumatic, no cyanosis or edema  Lab Results:  No results found for this or any previous visit (from the past 48 hour(s)).  ABGS  Recent Labs  09/07/14 1735  PHART 7.403  PO2ART 84.4  TCO2 21.2  HCO3 24.4*   CULTURES No results found for this or any previous visit (from the past 240 hour(s)). Studies/Results: No results found.  Medications:  Prior to Admission:  Prescriptions prior to admission  Medication Sig Dispense Refill Last Dose  . albuterol (PROVENTIL) (2.5 MG/3ML) 0.083% nebulizer solution Take 2.5 mg by nebulization every 6 (six) hours as needed.   09/07/2014 at Unknown time  . ALPRAZolam (XANAX) 1 MG tablet Take 1 mg by mouth 3 (three) times daily as needed for anxiety.    09/07/2014 at Unknown time  . chlorpheniramine-HYDROcodone (TUSSIONEX PENNKINETIC ER) 10-8 MG/5ML LQCR Take 5 mLs by mouth every 12 (twelve) hours as needed for cough. 115 mL 0 Past Week at Unknown time  . clonazePAM (KLONOPIN) 1 MG tablet Take 1 mg by mouth 2 (two) times daily as needed for anxiety.    09/07/2014 at Unknown time  . guaiFENesin (MUCINEX) 600 MG 12 hr tablet Take 600-1,200 mg by mouth every morning.    09/07/2014 at Unknown  time  . naproxen sodium (ALEVE) 220 MG tablet Take 220 mg by mouth 2 (two) times daily.   Past Week at Unknown time  . predniSONE (DELTASONE) 20 MG tablet Take 2 tablets (40 mg total) by mouth daily. 8 tablet 0 Past Week at Unknown time  . ramipril (ALTACE) 5 MG capsule Take 5 mg by mouth daily.    Past Week at Unknown time   Scheduled: . acetylcysteine  3 mL Nebulization Q4H  . azithromycin  500 mg Oral q1800  . enoxaparin (LOVENOX) injection  30 mg Subcutaneous Q24H  . guaiFENesin  1,200 mg Oral BID  . ipratropium-albuterol  3 mL Nebulization Q4H  . methylPREDNISolone (SOLU-MEDROL) injection  60 mg Intravenous Q6H  . naproxen  250 mg Oral BID WC  . ramipril  5 mg Oral Daily   Continuous:  TAV:[id number] **OR** acetaminophen, albuterol, ALPRAZolam, alum & mag hydroxide-simeth, bisacodyl, chlorpheniramine-HYDROcodone, clonazePAM, docusate sodium, magnesium hydroxide  Assesment:she was admitted with COPD exacerbation and I think she's better.  Active Problems:   COPD exacerbation    Plan:discharge home today she refuses home health services    LOS: 3 days   Aimee Rodriguez L 09/10/2014, 9:06 AM

## 2014-09-10 NOTE — Progress Notes (Signed)
Discharge instruction reviewed with patient and spouse. No distress noted before discharge. Patient given a breathing treatment before discharge. IV removed. Patient escorted to lobby via wheelchair .

## 2014-09-13 ENCOUNTER — Other Ambulatory Visit (HOSPITAL_COMMUNITY): Payer: Self-pay | Admitting: Pulmonary Disease

## 2014-09-13 DIAGNOSIS — R109 Unspecified abdominal pain: Secondary | ICD-10-CM

## 2014-09-13 DIAGNOSIS — R911 Solitary pulmonary nodule: Secondary | ICD-10-CM

## 2014-09-13 DIAGNOSIS — R634 Abnormal weight loss: Secondary | ICD-10-CM

## 2014-09-15 NOTE — Discharge Summary (Signed)
Physician Discharge Summary  Patient ID: Aimee Rodriguez MRN: [id number] DOB/AGE: [birth year] 64 y.o. Primary Care Physician:Cheri Ayotte L, MD Admit date: 09/07/2014 Discharge date: 09/15/2014    Discharge Diagnoses:   Active Problems:   Weight loss   HTN (hypertension)   Coronary atherosclerosis   Essential and other specified forms of tremor   COPD exacerbation     Medication List    STOP taking these medications        albuterol (2.5 MG/3ML) 0.083% nebulizer solution  Commonly known as:  PROVENTIL      TAKE these medications        ALEVE 220 MG tablet  Generic drug:  naproxen sodium  Take 220 mg by mouth 2 (two) times daily.     ALPRAZolam 1 MG tablet  Commonly known as:  XANAX  Take 1 mg by mouth 3 (three) times daily as needed for anxiety.     chlorpheniramine-HYDROcodone 10-8 MG/5ML Lqcr  Commonly known as:  TUSSIONEX PENNKINETIC ER  Take 5 mLs by mouth every 12 (twelve) hours as needed for cough.     clonazePAM 1 MG tablet  Commonly known as:  KLONOPIN  Take 1 mg by mouth 2 (two) times daily as needed for anxiety.     guaiFENesin 600 MG 12 hr tablet  Commonly known as:  MUCINEX  Take 600-1,200 mg by mouth every morning.     ipratropium-albuterol 0.5-2.5 (3) MG/3ML Soln  Commonly known as:  DUONEB  Take 3 mLs by nebulization every 4 (four) hours.     levofloxacin 500 MG tablet  Commonly known as:  LEVAQUIN  Take 1 tablet (500 mg total) by mouth daily.     predniSONE 10 MG tablet  Commonly known as:  DELTASONE  Take 2 tablets (20 mg total) by mouth daily with breakfast.     ramipril 5 MG capsule  Commonly known as:  ALTACE  Take 5 mg by mouth daily.        Discharged Condition:improved    Consults:none  Significant Diagnostic Studies: Dg Chest 2 View  09/03/2014   CLINICAL DATA:  Dyspnea, cough, congestion  EXAM: CHEST  2 VIEW  COMPARISON:  CT chest 02/21/2014  FINDINGS: The lungs are hyperinflated likely secondary to COPD. There is  no focal parenchymal opacity, pleural effusion, or pneumothorax. The heart and mediastinal contours are unremarkable.  The osseous structures are unremarkable.  IMPRESSION: No active cardiopulmonary disease.   Electronically Signed   By: Kathreen Devoid   On: 09/03/2014 16:57   Dg Chest Port 1 View  09/07/2014   CLINICAL DATA:  Six day history of progressive difficulty breathing and congestion  EXAM: PORTABLE CHEST - 1 VIEW  COMPARISON:  September 03, 2014  FINDINGS: There is underlying emphysematous change. There is no edema or consolidation. The heart size and pulmonary vascularity are within normal limits. No adenopathy. There is postoperative change in the region of the thyroid. No bone lesions.  IMPRESSION: Underlying emphysematous change.  No edema or consolidation.   Electronically Signed   By: Lowella Grip M.D.   On: 09/07/2014 15:24    Lab Results: Basic Metabolic Panel: No results for input(s): NA, K, CL, CO2, GLUCOSE, BUN, CREATININE, CALCIUM, MG, PHOS in the last 72 hours. Liver Function Tests: No results for input(s): AST, ALT, ALKPHOS, BILITOT, PROT, ALBUMIN in the last 72 hours.   CBC: No results for input(s): WBC, NEUTROABS, HGB, HCT, MCV, PLT in the last 72 hours.  No results found for this  or any previous visit (from the past 240 hour(s)).   Hospital Course: this is a 63 year old who is known to have severe COPD and who came to the emergency department with increasing shortness of breath cough and congestion. She was started on intravenous steroids IV antibiotics and inhaled bronchodilators and slowly improved. By the time of discharge was back to baseline but was still coughing. She has limited understanding of her disease so she had educational materials provided as well as face-to-face discussion of COPD.  Discharge Exam: Blood pressure 154/87, pulse 67, temperature 97.9 F (36.6 C), temperature source Oral, resp. rate 20, height 5\' 2"  (1.575 m), weight 39.8 kg (87 lb 11.9  oz), SpO2 96 %. She is awake and alert. She has rhonchi bilaterally. She is very thin.  Disposition: home. We discussed home health services but she does not want to do that      Signed: Joshia Kitchings L   09/15/2014, 8:50 AM

## 2014-09-18 ENCOUNTER — Other Ambulatory Visit (HOSPITAL_COMMUNITY): Payer: Medicare Other

## 2014-09-25 ENCOUNTER — Encounter: Payer: Self-pay | Admitting: Neurology

## 2014-09-26 ENCOUNTER — Ambulatory Visit (HOSPITAL_COMMUNITY)
Admission: RE | Admit: 2014-09-26 | Discharge: 2014-09-26 | Disposition: A | Discharge status: Home / Self Care | Payer: Medicare Other | Source: Ambulatory Visit | Attending: Pulmonary Disease | Admitting: Pulmonary Disease

## 2014-09-26 ENCOUNTER — Other Ambulatory Visit (HOSPITAL_COMMUNITY): Payer: Medicare Other

## 2014-09-26 DIAGNOSIS — R1084 Generalized abdominal pain: Secondary | ICD-10-CM | POA: Insufficient documentation

## 2014-09-26 DIAGNOSIS — R634 Abnormal weight loss: Secondary | ICD-10-CM

## 2014-09-26 DIAGNOSIS — R911 Solitary pulmonary nodule: Secondary | ICD-10-CM | POA: Diagnosis not present

## 2014-09-26 DIAGNOSIS — R109 Unspecified abdominal pain: Secondary | ICD-10-CM

## 2014-09-26 MED ORDER — IOHEXOL 300 MG/ML  SOLN
100.0000 mL | Freq: Once | INTRAMUSCULAR | Status: AC | PRN
  Administered 2014-09-26: 100 mL via INTRAVENOUS

## 2014-10-01 ENCOUNTER — Encounter: Payer: Self-pay | Admitting: Neurology

## 2014-10-22 ENCOUNTER — Ambulatory Visit: Payer: Medicare Other | Admitting: Adult Health

## 2014-10-28 ENCOUNTER — Other Ambulatory Visit (HOSPITAL_COMMUNITY): Payer: Self-pay | Admitting: Pulmonary Disease

## 2014-10-28 DIAGNOSIS — Z139 Encounter for screening, unspecified: Secondary | ICD-10-CM

## 2014-11-06 ENCOUNTER — Ambulatory Visit (HOSPITAL_COMMUNITY)
Admission: RE | Admit: 2014-11-06 | Discharge: 2014-11-06 | Disposition: A | Discharge status: Home / Self Care | Payer: Medicare Other | Source: Ambulatory Visit | Attending: Pulmonary Disease | Admitting: Pulmonary Disease

## 2014-11-06 DIAGNOSIS — Z139 Encounter for screening, unspecified: Secondary | ICD-10-CM

## 2014-11-06 DIAGNOSIS — Z1231 Encounter for screening mammogram for malignant neoplasm of breast: Secondary | ICD-10-CM | POA: Insufficient documentation

## 2015-01-02 DIAGNOSIS — I10 Essential (primary) hypertension: Secondary | ICD-10-CM | POA: Diagnosis not present

## 2015-01-02 DIAGNOSIS — R300 Dysuria: Secondary | ICD-10-CM | POA: Diagnosis not present

## 2015-01-02 DIAGNOSIS — J449 Chronic obstructive pulmonary disease, unspecified: Secondary | ICD-10-CM | POA: Diagnosis not present

## 2015-01-02 DIAGNOSIS — R251 Tremor, unspecified: Secondary | ICD-10-CM | POA: Diagnosis not present

## 2015-01-09 ENCOUNTER — Other Ambulatory Visit (HOSPITAL_COMMUNITY): Payer: Self-pay | Admitting: Pulmonary Disease

## 2015-01-09 DIAGNOSIS — R519 Headache, unspecified: Secondary | ICD-10-CM

## 2015-01-09 DIAGNOSIS — R51 Headache: Principal | ICD-10-CM

## 2015-01-16 ENCOUNTER — Other Ambulatory Visit (HOSPITAL_COMMUNITY): Payer: Self-pay

## 2015-01-20 ENCOUNTER — Ambulatory Visit (HOSPITAL_COMMUNITY)
Admission: RE | Admit: 2015-01-20 | Discharge: 2015-01-20 | Disposition: A | Discharge status: Home / Self Care | Payer: Medicare Other | Source: Ambulatory Visit | Attending: Pulmonary Disease | Admitting: Pulmonary Disease

## 2015-01-20 DIAGNOSIS — R51 Headache: Secondary | ICD-10-CM | POA: Diagnosis not present

## 2015-01-20 DIAGNOSIS — R410 Disorientation, unspecified: Secondary | ICD-10-CM | POA: Insufficient documentation

## 2015-01-20 DIAGNOSIS — R519 Headache, unspecified: Secondary | ICD-10-CM

## 2015-01-20 DIAGNOSIS — I6782 Cerebral ischemia: Secondary | ICD-10-CM | POA: Diagnosis not present

## 2015-03-28 DIAGNOSIS — R251 Tremor, unspecified: Secondary | ICD-10-CM | POA: Diagnosis not present

## 2015-03-28 DIAGNOSIS — I10 Essential (primary) hypertension: Secondary | ICD-10-CM | POA: Diagnosis not present

## 2015-03-28 DIAGNOSIS — J449 Chronic obstructive pulmonary disease, unspecified: Secondary | ICD-10-CM | POA: Diagnosis not present

## 2015-03-28 DIAGNOSIS — R634 Abnormal weight loss: Secondary | ICD-10-CM | POA: Diagnosis not present

## 2015-04-10 ENCOUNTER — Other Ambulatory Visit (HOSPITAL_COMMUNITY): Payer: Self-pay | Admitting: Pulmonary Disease

## 2015-04-10 DIAGNOSIS — R1310 Dysphagia, unspecified: Secondary | ICD-10-CM

## 2015-04-21 ENCOUNTER — Telehealth (HOSPITAL_COMMUNITY): Payer: Self-pay | Admitting: Physical Therapy

## 2015-04-21 NOTE — Telephone Encounter (Signed)
SHe is sick and not feeling well.

## 2015-04-22 ENCOUNTER — Other Ambulatory Visit (HOSPITAL_COMMUNITY): Payer: Self-pay | Admitting: Pulmonary Disease

## 2015-04-22 ENCOUNTER — Ambulatory Visit (HOSPITAL_COMMUNITY)
Admission: RE | Admit: 2015-04-22 | Discharge: 2015-04-22 | Disposition: A | Discharge status: Home / Self Care | Payer: Medicare Other | Source: Ambulatory Visit | Attending: Pulmonary Disease | Admitting: Pulmonary Disease

## 2015-04-22 ENCOUNTER — Ambulatory Visit (HOSPITAL_COMMUNITY): Payer: Medicare Other | Admitting: Speech Pathology

## 2015-04-22 DIAGNOSIS — R1310 Dysphagia, unspecified: Secondary | ICD-10-CM

## 2015-04-22 DIAGNOSIS — R479 Unspecified speech disturbances: Secondary | ICD-10-CM | POA: Insufficient documentation

## 2015-04-22 DIAGNOSIS — R633 Feeding difficulties: Secondary | ICD-10-CM | POA: Diagnosis not present

## 2015-04-22 DIAGNOSIS — R1314 Dysphagia, pharyngoesophageal phase: Secondary | ICD-10-CM

## 2015-04-22 NOTE — Therapy (Signed)
Modified Barium Swallow Procedure Note  Objective Swallowing Evaluation:  (MBSS)  Patient Details  Name: Aimee Rodriguez MRN: [id number] Date of Birth: 05/14/1951  Today's Date: 04/22/2015  Problem List:  Patient Active Problem List   Diagnosis Date Noted  . COPD exacerbation 09/07/2014  . Essential and other specified forms of tremor 06/28/2014  . Coronary atherosclerosis 09/13/2013  . HTN (hypertension) 08/16/2013  . Lung nodule 08/16/2013  . Aortic dilatation 08/16/2013  . Weight loss 11/23/2011  . Epigastric pain 11/23/2011    Past Medical History:  Past Medical History  Diagnosis Date  . Degeneration of intervertebral disc   . Cardiac dysrhythmia, unspecified   . Anxiety   . Reflux esophagitis   . HTN (hypertension)   . Chronic bronchitis   . COPD (chronic obstructive pulmonary disease)   . Paroxysmal supraventricular tachycardia     s/p ablation in past  . Essential and other specified forms of tremor 06/28/2014    Past Surgical History:  Past Surgical History  Procedure Laterality Date  . Laparotomy    . Total thyroidectomy    . Vaginal hysterectomy    . Oophorectomy    . Back surgery    . Tonsillectomy    . Foot surgery Right     X2  . Neck surgery    . Polyps of colon    . Cholecystectomy    . Cardiac ablation for paroxysmal supraventricular tachycardia    . Hemorroidectomy    . Esophagogastroduodenoscopy  11/25/2011    Procedure: ESOPHAGOGASTRODUODENOSCOPY (EGD);  Surgeon: Daneil Dolin, MD;  Location: [facility];  Service: Endoscopy;  Laterality: N/A;  12:45   General Information Date of Onset: 11/01/14 Other Pertinent Information: Aimee Rodriguez is a 64 yo woman who was referred by Dr. Luan Pulling for SLP evaluation and MBSS if indicated. Type of Study:  (MBSS) Reason for Referral: Objectively evaluate swallowing function Diet Prior to this Study: Regular, Thin liquids Temperature Spikes Noted: No Respiratory Status: Room air History of  Recent Intubation: No Behavior/Cognition: Alert, Cooperative Oral Motor / Sensory Function: Within functional limits (Pt with tremor) Self-Feeding Abilities: Able to feed self Patient Positioning: Upright in chair/Tumbleform Baseline Vocal Quality: Normal Volitional Cough: Strong Volitional Swallow: Able to elicit Anatomy: Within functional limits Pharyngeal Secretions: Not observed secondary MBS   HPI:  Other Pertinent Information: Aimee Rodriguez is a 64 yo woman who was referred by Dr. Luan Pulling for SLP evaluation and MBSS if indicated.  Subjective: "My lungs hurt after I eat and I have to lay down." Special Tests: MBSS   Most recent MRI IMPRESSION: 1. No acute intracranial abnormality or mass. 2. Moderate chronic small vessel ischemic disease. Chronic left thalamic lacunar infarct.  Reason for Referral Patient was referred for a  (MBSS) to assess the efficiency of her swallow function, rule out aspiration and make recommendations regarding safe dietary consistencies, effective compensatory strategies, and safe eating environment.  Oral Preparation / Oral Phase Oral Preparation/Oral Phase Oral Phase: Within functional limits  Pharyngeal Phase Pharyngeal Phase Pharyngeal Phase: Impaired Pharyngeal - Thin Pharyngeal- Thin Cup: Swallow initiation at pyriform sinus, Penetration/Aspiration during swallow, Lateral channel residue Pharyngeal- Thin Straw: Swallow initiation at pyriform sinus, Penetration/Aspiration during swallow, Lateral channel residue Pharyngeal - Solids Pharyngeal- Puree: Within functional limits Pharyngeal- Mechanical Soft: Within functional limits (Regular texture)  Cervical Esophageal Phase Cervical Esophageal Phase Cervical Esophageal Phase: Within functional limits  Clinical Impression    Aimee Rodriguez was seen for MBSS today due to pt complaints  of her "lungs filling up with fluid" and pain during swallow. She denies difficulty swallowing however  and doesn't understand why the test is being completed. She was extremely anxious and needed constant encouragement to complete the test. She was agreeable to limited intake. Pt presents with premature spillage of thin liquids with swallow trigger after spilling to the pyriforms which results in flash penetration of thins during the swallow and resultant mild residuals in lateral channels post swallow which clear with repeat/dry swallow. No aspiration observed and risk for aspiration is judged to be mild. Towards the end of the study, pt began to breathe quickly in and out through her mouth, however she responded well to cues to inhale through her nose and exhale out her mouth slowly and breathing returned to normal. Pt states that this "happens" to her every time she eats and she feels like she cannot breathe and becomes nauseous and must lay down. Pt appears very anxious and was only mildly receptive to feedback from SLP. At the conclusion of the study, I spoke with the patient and her husband regarding the results (essentially normal) to help get a better idea of her symptoms. Her husband reports that the tremor started about 2 years ago, but the breathing and "filling up" complaints started about 6 months ago. He reports that she only eats a very small amount and must lay down immediately following. It is difficult to determine the exact nature of her symptoms. Pt with history of severe COPD and this likely is having an impact on her perception of breathing and swallowing. SLP discussed with pt/husband that I would be willing to work with pt for 1-2 visits in office to follow her for diet tolerance and strategies to facilitate coordination of respiration during swallow. Pt is unsure she wants to do this at this time.   Recommendations/Treatment Swallow Evaluation Recommendations SLP Diet Recommendations: Thin, Age appropriate regular solids Liquid Administration via: Cup, Straw Medication Administration:  Whole meds with liquid Supervision: Patient able to self feed Compensations: Small sips/bites Postural Changes: Seated upright at 90 degrees  Prognosis Prognosis Prognosis for Safe Diet Advancement: Good Prognosis for Safe Diet Advancement: Good     CHL IP GO 04/22/2015  Functional Assessment Tool Used clinical judgement; MBSS  Functional Limitations Swallowing  Swallow Current Status (I6803) CI  Swallow Goal Status (O1224) [facility]  Swallow Discharge Status [phone]) CI                                                                   Thank you,  Genene Churn, [place]        [place] 04/22/2015, 11:26 PM

## 2015-05-21 ENCOUNTER — Other Ambulatory Visit (HOSPITAL_COMMUNITY): Payer: Self-pay | Admitting: Pulmonary Disease

## 2015-05-21 DIAGNOSIS — R911 Solitary pulmonary nodule: Secondary | ICD-10-CM

## 2015-05-23 ENCOUNTER — Ambulatory Visit (HOSPITAL_COMMUNITY): Payer: Medicare Other

## 2015-05-23 ENCOUNTER — Ambulatory Visit (HOSPITAL_COMMUNITY)
Admission: RE | Admit: 2015-05-23 | Discharge: 2015-05-23 | Disposition: A | Discharge status: Home / Self Care | Payer: Medicare Other | Source: Ambulatory Visit | Attending: Pulmonary Disease | Admitting: Pulmonary Disease

## 2015-05-23 DIAGNOSIS — J449 Chronic obstructive pulmonary disease, unspecified: Secondary | ICD-10-CM | POA: Diagnosis not present

## 2015-05-23 DIAGNOSIS — R911 Solitary pulmonary nodule: Secondary | ICD-10-CM | POA: Diagnosis not present

## 2015-05-23 LAB — POCT I-STAT CREATININE: CREATININE: 1 mg/dL (ref 0.44–1.00)

## 2015-05-23 MED ORDER — IOHEXOL 300 MG/ML  SOLN
75.0000 mL | Freq: Once | INTRAMUSCULAR | Status: AC | PRN
  Administered 2015-05-23: 75 mL via INTRAVENOUS

## 2015-05-28 ENCOUNTER — Encounter (HOSPITAL_COMMUNITY): Payer: Self-pay | Admitting: Emergency Medicine

## 2015-05-28 ENCOUNTER — Emergency Department (HOSPITAL_COMMUNITY)
Admission: EM | Admit: 2015-05-28 | Discharge: 2015-05-28 | Disposition: A | Discharge status: Home / Self Care | Payer: Medicare Other | Attending: Emergency Medicine | Admitting: Emergency Medicine

## 2015-05-28 DIAGNOSIS — J449 Chronic obstructive pulmonary disease, unspecified: Secondary | ICD-10-CM | POA: Diagnosis not present

## 2015-05-28 DIAGNOSIS — I10 Essential (primary) hypertension: Secondary | ICD-10-CM | POA: Insufficient documentation

## 2015-05-28 DIAGNOSIS — Z79899 Other long term (current) drug therapy: Secondary | ICD-10-CM | POA: Diagnosis not present

## 2015-05-28 DIAGNOSIS — R0602 Shortness of breath: Secondary | ICD-10-CM | POA: Diagnosis present

## 2015-05-28 DIAGNOSIS — Z8719 Personal history of other diseases of the digestive system: Secondary | ICD-10-CM | POA: Diagnosis not present

## 2015-05-28 DIAGNOSIS — Z8669 Personal history of other diseases of the nervous system and sense organs: Secondary | ICD-10-CM | POA: Diagnosis not present

## 2015-05-28 DIAGNOSIS — R300 Dysuria: Secondary | ICD-10-CM | POA: Insufficient documentation

## 2015-05-28 DIAGNOSIS — F419 Anxiety disorder, unspecified: Secondary | ICD-10-CM

## 2015-05-28 DIAGNOSIS — Z792 Long term (current) use of antibiotics: Secondary | ICD-10-CM | POA: Insufficient documentation

## 2015-05-28 DIAGNOSIS — Z87891 Personal history of nicotine dependence: Secondary | ICD-10-CM | POA: Insufficient documentation

## 2015-05-28 DIAGNOSIS — Z7952 Long term (current) use of systemic steroids: Secondary | ICD-10-CM | POA: Diagnosis not present

## 2015-05-28 DIAGNOSIS — R0682 Tachypnea, not elsewhere classified: Secondary | ICD-10-CM | POA: Diagnosis not present

## 2015-05-28 MED ORDER — LORAZEPAM 2 MG/ML IJ SOLN
1.0000 mg | Freq: Once | INTRAMUSCULAR | Status: AC
  Administered 2015-05-28: 1 mg via INTRAVENOUS
  Filled 2015-05-28: qty 1

## 2015-05-28 NOTE — ED Notes (Signed)
Pt called EMS for SOB. Pt found to be "jittery" by EMS. Respiratory symptoms subsided when EMS calmed pt. Pt hypertensive en route.

## 2015-05-28 NOTE — ED Provider Notes (Signed)
CSN: [id number]     Arrival date & time 05/28/15  1855 History   First MD Initiated Contact with Patient 05/28/15 1926     Chief Complaint  Patient presents with  . Shortness of Breath     (Consider location/radiation/quality/duration/timing/severity/associated sxs/prior Treatment) Patient is a 64 y.o. female presenting with shortness of breath. The history is provided by the patient, the EMS personnel and the spouse.  Shortness of Breath Associated symptoms: no abdominal pain, no chest pain, no fever, no headaches and no rash    patient followed by Dr. Luan Pulling. Patient with an acute exacerbation of shortness of breath EMS called for that patient also with the same anxiety. Shortness of breath resolved on the way in. Patient does have a history of COPD. Does use breathing treatments at home. But the breathing problem has resolved. Patient now feeling still somewhat anxious feels that her Xanax needs to be increased. This is managed by her primary care doctor Dr. Oletta Lamas.  Past Medical History  Diagnosis Date  . Degeneration of intervertebral disc   . Cardiac dysrhythmia, unspecified   . Anxiety   . Reflux esophagitis   . HTN (hypertension)   . Chronic bronchitis   . COPD (chronic obstructive pulmonary disease)   . Paroxysmal supraventricular tachycardia     s/p ablation in past  . Essential and other specified forms of tremor 06/28/2014   Past Surgical History  Procedure Laterality Date  . Laparotomy    . Total thyroidectomy    . Vaginal hysterectomy    . Oophorectomy    . Back surgery    . Tonsillectomy    . Foot surgery Right     X2  . Neck surgery    . Polyps of colon    . Cholecystectomy    . Cardiac ablation for paroxysmal supraventricular tachycardia    . Hemorroidectomy    . Esophagogastroduodenoscopy  11/25/2011    Procedure: ESOPHAGOGASTRODUODENOSCOPY (EGD);  Surgeon: Daneil Dolin, MD;  Location: [facility];  Service: Endoscopy;  Laterality: N/A;  12:45    Family History  Problem Relation Age of Onset  . Cancer Brother   . Cancer Sister   . Colon cancer Neg Hx   . Lung disease Mother   . Heart disease Mother   . Heart disease Father   . Stroke Father   . Heart attack Brother    History  Substance Use Topics  . Smoking status: Former Smoker -- 1.00 packs/day    Types: Cigarettes  . Smokeless tobacco: Never Used     Comment: quit about 3 yrs   . Alcohol Use: No   OB History    Gravida Para Term Preterm AB TAB SAB Ectopic Multiple Living   4 4 4             Review of Systems  Constitutional: Negative for fever.  HENT: Negative for congestion.   Eyes: Negative for visual disturbance.  Respiratory: Positive for shortness of breath.   Cardiovascular: Negative for chest pain.  Gastrointestinal: Negative for abdominal pain.  Genitourinary: Positive for dysuria.  Skin: Negative for rash.  Neurological: Negative for headaches.  Hematological: Does not bruise/bleed easily.  Psychiatric/Behavioral: Negative for confusion. The patient is nervous/anxious.       Allergies  Cephalexin; Coconut flavor; Codeine; Demerol; and Sulfa antibiotics  Home Medications   Prior to Admission medications   Medication Sig Start Date End Date Taking? Authorizing Provider  ALPRAZolam Duanne Moron) 1 MG tablet Take 1 mg  by mouth 3 (three) times daily as needed for anxiety.     Historical Provider, MD  chlorpheniramine-HYDROcodone (TUSSIONEX PENNKINETIC ER) 10-8 MG/5ML LQCR Take 5 mLs by mouth every 12 (twelve) hours as needed for cough. 09/03/14   Virgel Manifold, MD  clonazePAM (KLONOPIN) 1 MG tablet Take 1 mg by mouth 2 (two) times daily as needed for anxiety.     Historical Provider, MD  guaiFENesin (MUCINEX) 600 MG 12 hr tablet Take 600-1,200 mg by mouth every morning.     Historical Provider, MD  ipratropium-albuterol (DUONEB) 0.5-2.5 (3) MG/3ML SOLN Take 3 mLs by nebulization every 4 (four) hours. 09/10/14   Sinda Du, MD  levofloxacin  (LEVAQUIN) 500 MG tablet Take 1 tablet (500 mg total) by mouth daily. 09/10/14   Sinda Du, MD  naproxen sodium (ALEVE) 220 MG tablet Take 220 mg by mouth 2 (two) times daily.    Historical Provider, MD  predniSONE (DELTASONE) 10 MG tablet Take 2 tablets (20 mg total) by mouth daily with breakfast. 09/10/14   Sinda Du, MD  ramipril (ALTACE) 5 MG capsule Take 5 mg by mouth daily.  11/10/11   Historical Provider, MD   BP 155/103 mmHg  Pulse 75  Temp(Src) 98.1 F (36.7 C) (Oral)  Resp 25  Ht 5\' 2"  (1.575 m)  Wt 98 lb (44.453 kg)  BMI 17.92 kg/m2  SpO2 95% Physical Exam  Constitutional: She is oriented to person, place, and time. She appears well-developed and well-nourished. No distress.  HENT:  Head: Normocephalic and atraumatic.  Mouth/Throat: Oropharynx is clear and moist.  Eyes: Conjunctivae and EOM are normal. Pupils are equal, round, and reactive to light.  Neck: Normal range of motion.  Cardiovascular: Normal rate, regular rhythm and normal heart sounds.   No murmur heard. Pulmonary/Chest: Effort normal and breath sounds normal. No respiratory distress.  Abdominal: Soft. Bowel sounds are normal. There is no tenderness.  Musculoskeletal: Normal range of motion.  Neurological: She is alert and oriented to person, place, and time. No cranial nerve deficit. She exhibits normal muscle tone. Coordination normal.  Skin: Skin is warm. No rash noted.  Nursing note and vitals reviewed.   ED Course  Procedures (including critical care time) Labs Review Labs Reviewed - No data to display  Imaging Review No results found.   EKG Interpretation None      MDM   Final diagnoses:  Anxiety    EMS initially called for shortness of breath that resolved on the way in. Then patient was jittery and feeling anxious. Patient's been on Xanax long-term. Feels like she needs to increase. Patient given a dose of Ativan here lungs with some slight wheezing which says is normal. Osseous  saturations have been good. Patient does not seem to be having a significant exacerbation of COPD. Patient does not 1 any additional treatment. I will have patient follow-up with her primary care doctor.   Fredia Sorrow, MD 05/28/15 2029

## 2015-05-28 NOTE — Discharge Instructions (Signed)
Follow-up with your doctor in the next few days. Return for any new or worse symptoms.

## 2015-05-28 NOTE — ED Notes (Signed)
Pt reports slurred speech since last Oct. Pt states she has been to speech therapy but doesn't know what caused her speech to become slurred. Pt says she has had CT scans but her PCP doesn't know what caused her speech problem. Pt states she needs for her anxiety medications to be increased. NAD noted.

## 2015-07-22 DIAGNOSIS — J449 Chronic obstructive pulmonary disease, unspecified: Secondary | ICD-10-CM | POA: Diagnosis not present

## 2015-07-22 DIAGNOSIS — M545 Low back pain: Secondary | ICD-10-CM | POA: Diagnosis not present

## 2015-07-22 DIAGNOSIS — I10 Essential (primary) hypertension: Secondary | ICD-10-CM | POA: Diagnosis not present

## 2015-07-22 DIAGNOSIS — R251 Tremor, unspecified: Secondary | ICD-10-CM | POA: Diagnosis not present

## 2015-10-15 DIAGNOSIS — J449 Chronic obstructive pulmonary disease, unspecified: Secondary | ICD-10-CM | POA: Diagnosis not present

## 2015-10-15 DIAGNOSIS — I10 Essential (primary) hypertension: Secondary | ICD-10-CM | POA: Diagnosis not present

## 2015-10-15 DIAGNOSIS — R300 Dysuria: Secondary | ICD-10-CM | POA: Diagnosis not present

## 2015-10-15 DIAGNOSIS — F419 Anxiety disorder, unspecified: Secondary | ICD-10-CM | POA: Diagnosis not present

## 2016-02-02 ENCOUNTER — Encounter (HOSPITAL_COMMUNITY): Payer: Self-pay

## 2016-03-15 DIAGNOSIS — K219 Gastro-esophageal reflux disease without esophagitis: Secondary | ICD-10-CM | POA: Diagnosis not present

## 2016-03-15 DIAGNOSIS — M545 Low back pain: Secondary | ICD-10-CM | POA: Diagnosis not present

## 2016-03-15 DIAGNOSIS — Z87891 Personal history of nicotine dependence: Secondary | ICD-10-CM | POA: Diagnosis not present

## 2016-03-15 DIAGNOSIS — I10 Essential (primary) hypertension: Secondary | ICD-10-CM | POA: Diagnosis not present

## 2016-03-15 DIAGNOSIS — Z7951 Long term (current) use of inhaled steroids: Secondary | ICD-10-CM | POA: Diagnosis not present

## 2016-03-15 DIAGNOSIS — J449 Chronic obstructive pulmonary disease, unspecified: Secondary | ICD-10-CM | POA: Diagnosis not present

## 2016-03-15 DIAGNOSIS — E039 Hypothyroidism, unspecified: Secondary | ICD-10-CM | POA: Diagnosis not present

## 2016-03-15 DIAGNOSIS — R1310 Dysphagia, unspecified: Secondary | ICD-10-CM | POA: Diagnosis not present

## 2016-03-15 DIAGNOSIS — G479 Sleep disorder, unspecified: Secondary | ICD-10-CM | POA: Diagnosis not present

## 2016-03-17 DIAGNOSIS — E039 Hypothyroidism, unspecified: Secondary | ICD-10-CM | POA: Diagnosis not present

## 2016-03-17 DIAGNOSIS — J449 Chronic obstructive pulmonary disease, unspecified: Secondary | ICD-10-CM | POA: Diagnosis not present

## 2016-03-17 DIAGNOSIS — M545 Low back pain: Secondary | ICD-10-CM | POA: Diagnosis not present

## 2016-03-17 DIAGNOSIS — I10 Essential (primary) hypertension: Secondary | ICD-10-CM | POA: Diagnosis not present

## 2016-03-17 DIAGNOSIS — Z87891 Personal history of nicotine dependence: Secondary | ICD-10-CM | POA: Diagnosis not present

## 2016-03-17 DIAGNOSIS — K219 Gastro-esophageal reflux disease without esophagitis: Secondary | ICD-10-CM | POA: Diagnosis not present

## 2016-03-17 DIAGNOSIS — Z7951 Long term (current) use of inhaled steroids: Secondary | ICD-10-CM | POA: Diagnosis not present

## 2016-03-17 DIAGNOSIS — G479 Sleep disorder, unspecified: Secondary | ICD-10-CM | POA: Diagnosis not present

## 2016-03-17 DIAGNOSIS — R1310 Dysphagia, unspecified: Secondary | ICD-10-CM | POA: Diagnosis not present

## 2016-03-18 DIAGNOSIS — G479 Sleep disorder, unspecified: Secondary | ICD-10-CM | POA: Diagnosis not present

## 2016-03-18 DIAGNOSIS — M545 Low back pain: Secondary | ICD-10-CM | POA: Diagnosis not present

## 2016-03-18 DIAGNOSIS — I10 Essential (primary) hypertension: Secondary | ICD-10-CM | POA: Diagnosis not present

## 2016-03-18 DIAGNOSIS — K219 Gastro-esophageal reflux disease without esophagitis: Secondary | ICD-10-CM | POA: Diagnosis not present

## 2016-03-18 DIAGNOSIS — Z87891 Personal history of nicotine dependence: Secondary | ICD-10-CM | POA: Diagnosis not present

## 2016-03-18 DIAGNOSIS — Z7951 Long term (current) use of inhaled steroids: Secondary | ICD-10-CM | POA: Diagnosis not present

## 2016-03-18 DIAGNOSIS — E039 Hypothyroidism, unspecified: Secondary | ICD-10-CM | POA: Diagnosis not present

## 2016-03-18 DIAGNOSIS — R1310 Dysphagia, unspecified: Secondary | ICD-10-CM | POA: Diagnosis not present

## 2016-03-18 DIAGNOSIS — J449 Chronic obstructive pulmonary disease, unspecified: Secondary | ICD-10-CM | POA: Diagnosis not present

## 2016-03-23 DIAGNOSIS — J449 Chronic obstructive pulmonary disease, unspecified: Secondary | ICD-10-CM | POA: Diagnosis not present

## 2016-03-23 DIAGNOSIS — Z87891 Personal history of nicotine dependence: Secondary | ICD-10-CM | POA: Diagnosis not present

## 2016-03-23 DIAGNOSIS — K219 Gastro-esophageal reflux disease without esophagitis: Secondary | ICD-10-CM | POA: Diagnosis not present

## 2016-03-23 DIAGNOSIS — R1310 Dysphagia, unspecified: Secondary | ICD-10-CM | POA: Diagnosis not present

## 2016-03-23 DIAGNOSIS — M545 Low back pain: Secondary | ICD-10-CM | POA: Diagnosis not present

## 2016-03-23 DIAGNOSIS — G479 Sleep disorder, unspecified: Secondary | ICD-10-CM | POA: Diagnosis not present

## 2016-03-23 DIAGNOSIS — I10 Essential (primary) hypertension: Secondary | ICD-10-CM | POA: Diagnosis not present

## 2016-03-23 DIAGNOSIS — E039 Hypothyroidism, unspecified: Secondary | ICD-10-CM | POA: Diagnosis not present

## 2016-03-23 DIAGNOSIS — R634 Abnormal weight loss: Secondary | ICD-10-CM | POA: Diagnosis not present

## 2016-03-23 DIAGNOSIS — F419 Anxiety disorder, unspecified: Secondary | ICD-10-CM | POA: Diagnosis not present

## 2016-03-23 DIAGNOSIS — Z7951 Long term (current) use of inhaled steroids: Secondary | ICD-10-CM | POA: Diagnosis not present

## 2016-03-25 DIAGNOSIS — M545 Low back pain: Secondary | ICD-10-CM | POA: Diagnosis not present

## 2016-03-25 DIAGNOSIS — J449 Chronic obstructive pulmonary disease, unspecified: Secondary | ICD-10-CM | POA: Diagnosis not present

## 2016-03-25 DIAGNOSIS — R1310 Dysphagia, unspecified: Secondary | ICD-10-CM | POA: Diagnosis not present

## 2016-03-25 DIAGNOSIS — K219 Gastro-esophageal reflux disease without esophagitis: Secondary | ICD-10-CM | POA: Diagnosis not present

## 2016-03-25 DIAGNOSIS — E039 Hypothyroidism, unspecified: Secondary | ICD-10-CM | POA: Diagnosis not present

## 2016-03-25 DIAGNOSIS — F419 Anxiety disorder, unspecified: Secondary | ICD-10-CM | POA: Diagnosis not present

## 2016-03-25 DIAGNOSIS — Z7951 Long term (current) use of inhaled steroids: Secondary | ICD-10-CM | POA: Diagnosis not present

## 2016-03-25 DIAGNOSIS — I10 Essential (primary) hypertension: Secondary | ICD-10-CM | POA: Diagnosis not present

## 2016-03-25 DIAGNOSIS — G479 Sleep disorder, unspecified: Secondary | ICD-10-CM | POA: Diagnosis not present

## 2016-03-25 DIAGNOSIS — R634 Abnormal weight loss: Secondary | ICD-10-CM | POA: Diagnosis not present

## 2016-03-25 DIAGNOSIS — Z87891 Personal history of nicotine dependence: Secondary | ICD-10-CM | POA: Diagnosis not present

## 2016-03-31 DIAGNOSIS — K219 Gastro-esophageal reflux disease without esophagitis: Secondary | ICD-10-CM | POA: Diagnosis not present

## 2016-03-31 DIAGNOSIS — R1310 Dysphagia, unspecified: Secondary | ICD-10-CM | POA: Diagnosis not present

## 2016-03-31 DIAGNOSIS — R634 Abnormal weight loss: Secondary | ICD-10-CM | POA: Diagnosis not present

## 2016-03-31 DIAGNOSIS — M545 Low back pain: Secondary | ICD-10-CM | POA: Diagnosis not present

## 2016-03-31 DIAGNOSIS — Z7951 Long term (current) use of inhaled steroids: Secondary | ICD-10-CM | POA: Diagnosis not present

## 2016-03-31 DIAGNOSIS — Z87891 Personal history of nicotine dependence: Secondary | ICD-10-CM | POA: Diagnosis not present

## 2016-03-31 DIAGNOSIS — G479 Sleep disorder, unspecified: Secondary | ICD-10-CM | POA: Diagnosis not present

## 2016-03-31 DIAGNOSIS — E039 Hypothyroidism, unspecified: Secondary | ICD-10-CM | POA: Diagnosis not present

## 2016-03-31 DIAGNOSIS — I10 Essential (primary) hypertension: Secondary | ICD-10-CM | POA: Diagnosis not present

## 2016-03-31 DIAGNOSIS — J449 Chronic obstructive pulmonary disease, unspecified: Secondary | ICD-10-CM | POA: Diagnosis not present

## 2016-03-31 DIAGNOSIS — F419 Anxiety disorder, unspecified: Secondary | ICD-10-CM | POA: Diagnosis not present

## 2016-04-13 DIAGNOSIS — M545 Low back pain: Secondary | ICD-10-CM | POA: Diagnosis not present

## 2016-04-13 DIAGNOSIS — F419 Anxiety disorder, unspecified: Secondary | ICD-10-CM | POA: Diagnosis not present

## 2016-04-13 DIAGNOSIS — R1310 Dysphagia, unspecified: Secondary | ICD-10-CM | POA: Diagnosis not present

## 2016-04-13 DIAGNOSIS — Z7951 Long term (current) use of inhaled steroids: Secondary | ICD-10-CM | POA: Diagnosis not present

## 2016-04-13 DIAGNOSIS — I10 Essential (primary) hypertension: Secondary | ICD-10-CM | POA: Diagnosis not present

## 2016-04-13 DIAGNOSIS — Z87891 Personal history of nicotine dependence: Secondary | ICD-10-CM | POA: Diagnosis not present

## 2016-04-13 DIAGNOSIS — J449 Chronic obstructive pulmonary disease, unspecified: Secondary | ICD-10-CM | POA: Diagnosis not present

## 2016-04-13 DIAGNOSIS — G479 Sleep disorder, unspecified: Secondary | ICD-10-CM | POA: Diagnosis not present

## 2016-04-13 DIAGNOSIS — K219 Gastro-esophageal reflux disease without esophagitis: Secondary | ICD-10-CM | POA: Diagnosis not present

## 2016-04-13 DIAGNOSIS — R634 Abnormal weight loss: Secondary | ICD-10-CM | POA: Diagnosis not present

## 2016-04-13 DIAGNOSIS — E039 Hypothyroidism, unspecified: Secondary | ICD-10-CM | POA: Diagnosis not present

## 2016-04-26 DIAGNOSIS — R1310 Dysphagia, unspecified: Secondary | ICD-10-CM | POA: Diagnosis not present

## 2016-04-26 DIAGNOSIS — Z87891 Personal history of nicotine dependence: Secondary | ICD-10-CM | POA: Diagnosis not present

## 2016-04-26 DIAGNOSIS — M545 Low back pain: Secondary | ICD-10-CM | POA: Diagnosis not present

## 2016-04-26 DIAGNOSIS — E039 Hypothyroidism, unspecified: Secondary | ICD-10-CM | POA: Diagnosis not present

## 2016-04-26 DIAGNOSIS — R634 Abnormal weight loss: Secondary | ICD-10-CM | POA: Diagnosis not present

## 2016-04-26 DIAGNOSIS — G479 Sleep disorder, unspecified: Secondary | ICD-10-CM | POA: Diagnosis not present

## 2016-04-26 DIAGNOSIS — K219 Gastro-esophageal reflux disease without esophagitis: Secondary | ICD-10-CM | POA: Diagnosis not present

## 2016-04-26 DIAGNOSIS — I10 Essential (primary) hypertension: Secondary | ICD-10-CM | POA: Diagnosis not present

## 2016-04-26 DIAGNOSIS — Z7951 Long term (current) use of inhaled steroids: Secondary | ICD-10-CM | POA: Diagnosis not present

## 2016-04-26 DIAGNOSIS — F419 Anxiety disorder, unspecified: Secondary | ICD-10-CM | POA: Diagnosis not present

## 2016-04-26 DIAGNOSIS — J449 Chronic obstructive pulmonary disease, unspecified: Secondary | ICD-10-CM | POA: Diagnosis not present

## 2016-05-11 DIAGNOSIS — M545 Low back pain: Secondary | ICD-10-CM | POA: Diagnosis not present

## 2016-05-11 DIAGNOSIS — R1310 Dysphagia, unspecified: Secondary | ICD-10-CM | POA: Diagnosis not present

## 2016-05-11 DIAGNOSIS — J449 Chronic obstructive pulmonary disease, unspecified: Secondary | ICD-10-CM | POA: Diagnosis not present

## 2016-05-11 DIAGNOSIS — E039 Hypothyroidism, unspecified: Secondary | ICD-10-CM | POA: Diagnosis not present

## 2016-05-11 DIAGNOSIS — Z7951 Long term (current) use of inhaled steroids: Secondary | ICD-10-CM | POA: Diagnosis not present

## 2016-05-11 DIAGNOSIS — I10 Essential (primary) hypertension: Secondary | ICD-10-CM | POA: Diagnosis not present

## 2016-05-11 DIAGNOSIS — G479 Sleep disorder, unspecified: Secondary | ICD-10-CM | POA: Diagnosis not present

## 2016-05-11 DIAGNOSIS — K219 Gastro-esophageal reflux disease without esophagitis: Secondary | ICD-10-CM | POA: Diagnosis not present

## 2016-05-11 DIAGNOSIS — Z87891 Personal history of nicotine dependence: Secondary | ICD-10-CM | POA: Diagnosis not present

## 2016-06-28 ENCOUNTER — Emergency Department (HOSPITAL_COMMUNITY)
Admission: EM | Admit: 2016-06-28 | Discharge: 2016-06-28 | Disposition: A | Discharge status: Home / Self Care | Payer: Medicare Other | Attending: Emergency Medicine | Admitting: Emergency Medicine

## 2016-06-28 ENCOUNTER — Emergency Department (HOSPITAL_COMMUNITY): Payer: Medicare Other

## 2016-06-28 ENCOUNTER — Encounter (HOSPITAL_COMMUNITY): Payer: Self-pay | Admitting: Emergency Medicine

## 2016-06-28 DIAGNOSIS — Z79899 Other long term (current) drug therapy: Secondary | ICD-10-CM | POA: Diagnosis not present

## 2016-06-28 DIAGNOSIS — R079 Chest pain, unspecified: Secondary | ICD-10-CM | POA: Diagnosis not present

## 2016-06-28 DIAGNOSIS — Z87891 Personal history of nicotine dependence: Secondary | ICD-10-CM | POA: Insufficient documentation

## 2016-06-28 DIAGNOSIS — J441 Chronic obstructive pulmonary disease with (acute) exacerbation: Secondary | ICD-10-CM | POA: Diagnosis not present

## 2016-06-28 DIAGNOSIS — R0602 Shortness of breath: Secondary | ICD-10-CM | POA: Diagnosis not present

## 2016-06-28 DIAGNOSIS — I10 Essential (primary) hypertension: Secondary | ICD-10-CM | POA: Diagnosis not present

## 2016-06-28 DIAGNOSIS — R0600 Dyspnea, unspecified: Secondary | ICD-10-CM | POA: Diagnosis not present

## 2016-06-28 DIAGNOSIS — R091 Pleurisy: Secondary | ICD-10-CM | POA: Diagnosis present

## 2016-06-28 DIAGNOSIS — R0682 Tachypnea, not elsewhere classified: Secondary | ICD-10-CM | POA: Diagnosis not present

## 2016-06-28 LAB — BASIC METABOLIC PANEL
Anion gap: 6 (ref 5–15)
BUN: 15 mg/dL (ref 6–20)
CO2: 27 mmol/L (ref 22–32)
Calcium: 9.1 mg/dL (ref 8.9–10.3)
Chloride: 103 mmol/L (ref 101–111)
Creatinine, Ser: 0.82 mg/dL (ref 0.44–1.00)
GFR calc Af Amer: >60 mL/min (ref >60)
GFR calc non Af Amer: >60 mL/min (ref >60)
Glucose, Bld: 100 mg/dL — ABNORMAL HIGH (ref 65–99)
Potassium: 3.9 mmol/L (ref 3.5–5.1)
Sodium: 136 mmol/L (ref 135–145)

## 2016-06-28 LAB — CBC WITH DIFFERENTIAL/PLATELET
Basophils Absolute: 0 10*3/uL (ref 0.0–0.1)
Basophils Relative: 0 %
Eosinophils Absolute: 0.1 10*3/uL (ref 0.0–0.7)
Eosinophils Relative: 2 %
HCT: 41 % (ref 36.0–46.0)
Hemoglobin: 13.9 g/dL (ref 12.0–15.0)
Lymphocytes Relative: 24 %
Lymphs Abs: 1.6 10*3/uL (ref 0.7–4.0)
MCH: 30.3 pg (ref 26.0–34.0)
MCHC: 33.9 g/dL (ref 30.0–36.0)
MCV: 89.3 fL (ref 78.0–100.0)
Monocytes Absolute: 0.3 10*3/uL (ref 0.1–1.0)
Monocytes Relative: 5 %
Neutro Abs: 4.6 10*3/uL (ref 1.7–7.7)
Neutrophils Relative %: 69 %
Platelets: 332 10*3/uL (ref 150–400)
RBC: 4.59 MIL/uL (ref 3.87–5.11)
RDW: 12.6 % (ref 11.5–15.5)
WBC: 6.7 10*3/uL (ref 4.0–10.5)

## 2016-06-28 LAB — URINALYSIS, ROUTINE W REFLEX MICROSCOPIC
Bilirubin Urine: NEGATIVE
Glucose, UA: NEGATIVE mg/dL
Hgb urine dipstick: NEGATIVE
Ketones, ur: NEGATIVE mg/dL
Leukocytes, UA: NEGATIVE
Nitrite: NEGATIVE
Protein, ur: NEGATIVE mg/dL
Specific Gravity, Urine: <1.005 — ABNORMAL LOW (ref 1.005–1.030)
pH: 5 (ref 5.0–8.0)

## 2016-06-28 LAB — TROPONIN I: Troponin I: <0.03 ng/mL (ref <0.03)

## 2016-06-28 MED ORDER — IPRATROPIUM-ALBUTEROL 0.5-2.5 (3) MG/3ML IN SOLN
3.0000 mL | Freq: Once | RESPIRATORY_TRACT | Status: AC
Start: 2016-06-28 — End: 2016-06-28
  Administered 2016-06-28: 3 mL via RESPIRATORY_TRACT
  Filled 2016-06-28: qty 3

## 2016-06-28 NOTE — ED Triage Notes (Signed)
Per EMS, pt reports "lung pain" x3 weeks. Pt reports pain with breathing, nad noted. Moderate anxiety.

## 2016-06-28 NOTE — ED Notes (Signed)
EDP aware that pt refusing remainder of breathing treatments and any future treatments. No other orders at this time.

## 2016-06-28 NOTE — ED Provider Notes (Signed)
[facility] DEPT Provider Note   CSN: [id number] Arrival date & time: 06/28/16  1240     History   Chief Complaint Chief Complaint  Patient presents with  . Pleurisy    HPI Aimee Rodriguez is a 65 y.o. female.  HPI  65 year old female with anemia and "lung pain." The past 2 weeks. She past history COPD. She states that her breathing somewhat worse over the last 2 weeks. Occasional nonproductive cough. No fevers or chills. No unusual swelling. At times she will get a sharp pain in her chest with breathing or coughing. She reports compliance with her medications.  Past Medical History:  Diagnosis Date  . Anxiety   . Cardiac dysrhythmia, unspecified   . Chronic bronchitis   . COPD (chronic obstructive pulmonary disease) ([place])   . Degeneration of intervertebral disc   . Essential and other specified forms of tremor 06/28/2014  . HTN (hypertension)   . Paroxysmal supraventricular tachycardia ([facility])    s/p ablation in past  . Reflux esophagitis     Patient Active Problem List   Diagnosis Date Noted  . COPD exacerbation ([place]) 09/07/2014  . Essential and other specified forms of tremor 06/28/2014  . Coronary atherosclerosis 09/13/2013  . HTN (hypertension) 08/16/2013  . Lung nodule 08/16/2013  . Aortic dilatation ([place]) 08/16/2013  . Weight loss 11/23/2011  . Epigastric pain 11/23/2011    Past Surgical History:  Procedure Laterality Date  . BACK SURGERY    . cardiac ablation for paroxysmal supraventricular tachycardia    . CHOLECYSTECTOMY    . ESOPHAGOGASTRODUODENOSCOPY  11/25/2011   Procedure: ESOPHAGOGASTRODUODENOSCOPY (EGD);  Surgeon: Daneil Dolin, MD;  [facility]: [facility];  Service: Endoscopy;  Laterality: N/A;  12:45  . FOOT SURGERY Right    X2  . HEMORROIDECTOMY    . LAPAROTOMY    . NECK SURGERY    . OOPHORECTOMY    . polyps of colon    . TONSILLECTOMY    . TOTAL THYROIDECTOMY    . VAGINAL HYSTERECTOMY      OB History    Gravida Para Term  Preterm AB Living   4 4 4          SAB TAB Ectopic Multiple Live Births                   Home Medications    Prior to Admission medications   Medication Sig Start Date End Date Taking? Authorizing Provider  ALPRAZolam Duanne Moron) 1 MG tablet Take 1 mg by mouth 3 (three) times daily as needed for anxiety.    Yes Historical Provider, MD  bisacodyl (BISACODYL) 5 MG EC tablet Take 5 mg by mouth daily as needed for mild constipation or moderate constipation.   Yes Historical Provider, MD  cephALEXin (KEFLEX) 500 MG capsule Take 500 mg by mouth daily as needed.    Yes Historical Provider, MD  clonazePAM (KLONOPIN) 1 MG tablet Take 1 mg by mouth 2 (two) times daily as needed for anxiety.    Yes Historical Provider, MD  guaiFENesin (MUCINEX) 600 MG 12 hr tablet Take 600-1,200 mg by mouth daily as needed for cough or to loosen phlegm.    Yes Historical Provider, MD  ipratropium-albuterol (DUONEB) 0.5-2.5 (3) MG/3ML SOLN Take 3 mLs by nebulization every 4 (four) hours. Patient taking differently: Take 3 mLs by nebulization 4 (four) times daily.  09/10/14  Yes Sinda Du, MD  ramipril (ALTACE) 5 MG capsule Take 5 mg by mouth daily.  11/10/11  Yes Historical Provider, MD    Family History Family History  Problem Relation Age of Onset  . Cancer Brother   . Cancer Sister   . Lung disease Mother   . Heart disease Mother   . Heart disease Father   . Stroke Father   . Heart attack Brother   . Colon cancer Neg Hx     Social History Social History  Substance Use Topics  . Smoking status: Former Smoker    Packs/day: 1.00    Types: Cigarettes  . Smokeless tobacco: Never Used     Comment: quit about 3 yrs   . Alcohol use No     Allergies   Cephalexin; Coconut flavor; Codeine; Demerol; Other; and Sulfa antibiotics   Review of Systems Review of Systems    All systems reviewed and negative, other than as noted in HPI.   Physical Exam Updated Vital Signs BP (!) 170/113 (BP Location:  Right Arm)   Pulse 85   Temp 98.3 F (36.8 C) (Oral)   Resp 18   Ht 5\' 6"  (1.676 m)   Wt 100 lb (45.4 kg)   SpO2 97%   BMI 16.14 kg/m   Physical Exam  Constitutional: She appears well-developed and well-nourished. No distress.  HENT:  Head: Normocephalic and atraumatic.  Eyes: Conjunctivae are normal.  Neck: Neck supple.  Cardiovascular: Normal rate and regular rhythm.   No murmur heard. Pulmonary/Chest: Effort normal and breath sounds normal. No respiratory distress.  Faint expiratory wheezing. No increased work of breathing. Can speak in complete sentences.  Abdominal: Soft. There is no tenderness.  Musculoskeletal: She exhibits no edema.  Neurological: She is alert.  Skin: Skin is warm and dry.  Psychiatric: She has a normal mood and affect.  Nursing note and vitals reviewed.    ED Treatments / Results  Labs (all labs ordered are listed, but only abnormal results are displayed) Labs Reviewed  BASIC METABOLIC PANEL - Abnormal; Notable for the following:       Result Value   Glucose, Bld 100 (*)    All other components within normal limits  URINALYSIS, ROUTINE W REFLEX MICROSCOPIC (NOT AT [facility]) - Abnormal; Notable for the following:    Specific Gravity, Urine <1.005 (*)    All other components within normal limits  CBC WITH DIFFERENTIAL/PLATELET  TROPONIN I    EKG  EKG Interpretation  Date/Time:  Monday June 28 2016 14:03:19 EDT Ventricular Rate:  72 PR Interval:    QRS Duration: 90 QT Interval:  391 QTC Calculation: 428 R Axis:   80 Text Interpretation:  Atrial fibrillation No significant change since last tracing Confirmed by Maryan Rued  MD, Loree Fee ([id number]) on 06/29/2016 9:05:12 PM       Radiology No results found.   Dg Chest 2 View  Result Date: 06/28/2016 CLINICAL DATA:  Chest pain and shortness of breath.  Dyspnea. EXAM: CHEST  2 VIEW COMPARISON:  09/07/2014 and chest CT dated 05/23/2015. FINDINGS: The heart remains normal in size and the aorta  remains tortuous with stable enlargement of the ascending thoracic aorta, with calcifications. The lungs remain hyperexpanded. Mild scoliosis and mild thoracic spine degenerative changes. Diffuse osteopenia. IMPRESSION: 1. Stable changes of COPD. 2. Aortic atherosclerosis without gross change in an ascending thoracic aortic aneurysm. The exact size of the aneurysm could be better determined with a chest CT with contrast. Electronically Signed   By: Claudie Revering M.D.   On: 06/28/2016 14:32    Procedures Procedures (including critical  care time)  Medications Ordered in ED Medications  ipratropium-albuterol (DUONEB) 0.5-2.5 (3) MG/3ML nebulizer solution 3 mL (3 mLs Nebulization Given 06/28/16 1517)     Initial Impression / Assessment and Plan / ED Course  I have reviewed the triage vital signs and the nursing notes.  Pertinent labs & imaging results that were available during my care of the patient were reviewed by me and considered in my medical decision making (see chart for details).  Clinical Course    65yF with dyspnea. Suspect this is primarily anxiety. She Is in no acute distress.. Minimal wheezing on exam with no increased work of breathing. Doubt ACS. Doubt PE. Significant anemia. Generally low suspicion for emergent process.  Final Clinical Impressions(s) / ED Diagnoses   Final diagnoses:  Dyspnea    New Prescriptions New Prescriptions   No medications on file     Virgel Manifold, MD 07/15/16 [phone]

## 2016-06-28 NOTE — ED Notes (Signed)
Pt taken to xray 

## 2016-06-28 NOTE — ED Notes (Signed)
Pt reports "call dr Luan Pulling" if you don't i'm leaving and going home". Pt informed EDP would assess pt. Pt reports "they better hurry up."

## 2016-06-29 DIAGNOSIS — E46 Unspecified protein-calorie malnutrition: Secondary | ICD-10-CM | POA: Diagnosis not present

## 2016-06-29 DIAGNOSIS — J449 Chronic obstructive pulmonary disease, unspecified: Secondary | ICD-10-CM | POA: Diagnosis not present

## 2016-06-29 DIAGNOSIS — I10 Essential (primary) hypertension: Secondary | ICD-10-CM | POA: Diagnosis not present

## 2016-06-29 DIAGNOSIS — M545 Low back pain: Secondary | ICD-10-CM | POA: Diagnosis not present

## 2016-06-30 DIAGNOSIS — R0602 Shortness of breath: Secondary | ICD-10-CM | POA: Diagnosis not present

## 2016-06-30 DIAGNOSIS — Z8709 Personal history of other diseases of the respiratory system: Secondary | ICD-10-CM | POA: Diagnosis not present

## 2016-06-30 DIAGNOSIS — Z5320 Procedure and treatment not carried out because of patient's decision for unspecified reasons: Secondary | ICD-10-CM | POA: Diagnosis not present

## 2016-07-31 ENCOUNTER — Emergency Department (HOSPITAL_COMMUNITY): Payer: Medicare Other

## 2016-07-31 ENCOUNTER — Emergency Department (HOSPITAL_COMMUNITY)
Admission: EM | Admit: 2016-07-31 | Discharge: 2016-07-31 | Disposition: A | Discharge status: Home / Self Care | Payer: Medicare Other | Attending: Emergency Medicine | Admitting: Emergency Medicine

## 2016-07-31 ENCOUNTER — Encounter (HOSPITAL_COMMUNITY): Payer: Self-pay | Admitting: Emergency Medicine

## 2016-07-31 DIAGNOSIS — I10 Essential (primary) hypertension: Secondary | ICD-10-CM | POA: Insufficient documentation

## 2016-07-31 DIAGNOSIS — J441 Chronic obstructive pulmonary disease with (acute) exacerbation: Secondary | ICD-10-CM | POA: Diagnosis not present

## 2016-07-31 DIAGNOSIS — R0602 Shortness of breath: Secondary | ICD-10-CM | POA: Diagnosis not present

## 2016-07-31 DIAGNOSIS — Z79899 Other long term (current) drug therapy: Secondary | ICD-10-CM | POA: Insufficient documentation

## 2016-07-31 DIAGNOSIS — Z87891 Personal history of nicotine dependence: Secondary | ICD-10-CM | POA: Insufficient documentation

## 2016-07-31 DIAGNOSIS — R05 Cough: Secondary | ICD-10-CM | POA: Diagnosis not present

## 2016-07-31 DIAGNOSIS — R069 Unspecified abnormalities of breathing: Secondary | ICD-10-CM | POA: Diagnosis not present

## 2016-07-31 LAB — CBC WITH DIFFERENTIAL/PLATELET
Basophils Absolute: 0 K/uL (ref 0.0–0.1)
Basophils Relative: 0 %
Eosinophils Absolute: 0 K/uL (ref 0.0–0.7)
Eosinophils Relative: 0 %
HCT: 41.8 % (ref 36.0–46.0)
Hemoglobin: 14.2 g/dL (ref 12.0–15.0)
Lymphocytes Relative: 20 %
Lymphs Abs: 1.3 K/uL (ref 0.7–4.0)
MCH: 30.5 pg (ref 26.0–34.0)
MCHC: 34 g/dL (ref 30.0–36.0)
MCV: 89.9 fL (ref 78.0–100.0)
Monocytes Absolute: 0.3 K/uL (ref 0.1–1.0)
Monocytes Relative: 4 %
Neutro Abs: 5.1 K/uL (ref 1.7–7.7)
Neutrophils Relative %: 76 %
Platelets: 256 K/uL (ref 150–400)
RBC: 4.65 MIL/uL (ref 3.87–5.11)
RDW: 12.8 % (ref 11.5–15.5)
WBC: 6.7 K/uL (ref 4.0–10.5)

## 2016-07-31 LAB — BASIC METABOLIC PANEL WITH GFR
Anion gap: 8 (ref 5–15)
BUN: 14 mg/dL (ref 6–20)
CO2: 25 mmol/L (ref 22–32)
Calcium: 9.3 mg/dL (ref 8.9–10.3)
Chloride: 102 mmol/L (ref 101–111)
Creatinine, Ser: 0.71 mg/dL (ref 0.44–1.00)
GFR calc Af Amer: >60 mL/min
GFR calc non Af Amer: >60 mL/min
Glucose, Bld: 123 mg/dL — ABNORMAL HIGH (ref 65–99)
Potassium: 3.4 mmol/L — ABNORMAL LOW (ref 3.5–5.1)
Sodium: 135 mmol/L (ref 135–145)

## 2016-07-31 LAB — TROPONIN I: Troponin I: <0.03 ng/mL

## 2016-07-31 MED ORDER — IPRATROPIUM-ALBUTEROL 0.5-2.5 (3) MG/3ML IN SOLN
3.0000 mL | Freq: Once | RESPIRATORY_TRACT | Status: AC
  Administered 2016-07-31: 3 mL via RESPIRATORY_TRACT
  Filled 2016-07-31: qty 3

## 2016-07-31 NOTE — ED Notes (Signed)
Pt much calmer now.  Denies any needs.

## 2016-07-31 NOTE — ED Notes (Signed)
Pt assisted to bedside commode.  Gait steady and pt required minimal assist.

## 2016-07-31 NOTE — ED Notes (Signed)
EDP at bedside  

## 2016-07-31 NOTE — Progress Notes (Signed)
Neb tx started, pt had tx on less than 30 seconds before she started coughing and said she had to stop. Pt then requesting O2. Explained to pt that she didn't need supplemental O2 since her sats were 99% on RA. Asked pt if she thought the flow would help with her breathing. She said yes, so she was placed on [facility]. Pt holding [place] to her nose so she can take it down when she doesn't want it. RN aware. RT will continue to monitor.

## 2016-07-31 NOTE — ED Triage Notes (Signed)
Pt reports sinus drainage into chest making her short of breath.  Pt very anxious on arrival and hyperventilating.  Denies cough.

## 2016-07-31 NOTE — ED Notes (Signed)
Pt taken to car via wheelchair.  Pt got out of wheelchair, moved car seat forward in 2 door car, and climbed into the backseat because she has to lay down to feel better.

## 2016-07-31 NOTE — ED Notes (Signed)
Pt continues to refuse everything.  States she wants [place] to put a tube down her throat to suck out all the phlegm.  Pt states she does not understand why she coughs for over an hour each morning when she wakes up.  Explained COPD to patient and importance of further testing to determine if anything new is wrong.  Pt wanted to leave without further testing and Dr. Lacinda Axon made aware.  Pt's husband came to nurses station while entering this note and states pt changed her mind and wants her tests now.

## 2016-07-31 NOTE — ED Provider Notes (Signed)
[facility] DEPT Provider Note   CSN: [id number] Arrival date & time: 07/31/16  [id number]   By signing my name below, I, Aimee Rodriguez, attest that this documentation has been prepared under the direction and in the presence of Aimee Christen, MD . Electronically Signed: Estanislado Rodriguez, Scribe. 07/31/2016. 9:22 AM.   History   Chief Complaint Chief Complaint  Patient presents with  . Shortness of Breath    The history is provided by the patient. No language interpreter was used.   HPI Comments:  Aimee Rodriguez is a 65 y.o. female with PMHx of HTN, bronchitis and COPD who presents to the Emergency Department complaining of worsening SOB. Pt reports sinus drainage into her chest making it difficult for her to breathe. Pt states that she has little lung tissue left due to her COPD. Pt has a nebulizer machine, does not smoke. Pt states that when she eats it feels like air from her lungs is coming back up to her head. Pt took a breathing treatment at 6:30 AM this morning with temporary relief.  Past Medical History:  Diagnosis Date  . Anxiety   . Cardiac dysrhythmia, unspecified   . Chronic bronchitis   . COPD (chronic obstructive pulmonary disease) ([place])   . Degeneration of intervertebral disc   . Essential and other specified forms of tremor 06/28/2014  . HTN (hypertension)   . Paroxysmal supraventricular tachycardia (H[facility])    s/p ablation in past  . Reflux esophagitis     Patient Active Problem List   Diagnosis Date Noted  . COPD exacerbation ([place]) 09/07/2014  . Essential and other specified forms of tremor 06/28/2014  . Coronary atherosclerosis 09/13/2013  . HTN (hypertension) 08/16/2013  . Lung nodule 08/16/2013  . Aortic dilatation ([place]) 08/16/2013  . Weight loss 11/23/2011  . Epigastric pain 11/23/2011    Past Surgical History:  Procedure Laterality Date  . BACK SURGERY    . cardiac ablation for paroxysmal supraventricular tachycardia    . CHOLECYSTECTOMY    .  ESOPHAGOGASTRODUODENOSCOPY  11/25/2011   Procedure: ESOPHAGOGASTRODUODENOSCOPY (EGD);  Surgeon: Aimee Dolin, MD;  Location[facility];  Service: Endoscopy;  Laterality: N/A;  12:45  . FOOT SURGERY Right    X2  . HEMORROIDECTOMY    . LAPAROTOMY    . NECK SURGERY    . OOPHORECTOMY    . polyps of colon    . TONSILLECTOMY    . TOTAL THYROIDECTOMY    . VAGINAL HYSTERECTOMY      OB History    Gravida Para Term Preterm AB Living   4 4 4          SAB TAB Ectopic Multiple Live Births                   Home Medications    Prior to Admission medications   Medication Sig Start Date End Date Taking? Authorizing Provider  ALPRAZolam Duanne Moron) 1 MG tablet Take 1 mg by mouth 3 (three) times daily as needed for anxiety.    Yes Historical Provider, MD  bisacodyl (BISACODYL) 5 MG EC tablet Take 5 mg by mouth daily as needed for mild constipation or moderate constipation.   Yes Historical Provider, MD  cephALEXin (KEFLEX) 500 MG capsule Take 500 mg by mouth daily as needed (INFECTION).    Yes Historical Provider, MD  clonazePAM (KLONOPIN) 1 MG tablet Take 1 mg by mouth 2 (two) times daily as needed for anxiety.    Yes Historical Provider, MD  guaiFENesin (MUCINEX) 600 MG 12 hr tablet Take 600-1,200 mg by mouth daily as needed for cough or to loosen phlegm.    Yes Historical Provider, MD  ipratropium-albuterol (DUONEB) 0.5-2.5 (3) MG/3ML SOLN Take 3 mLs by nebulization every 4 (four) hours. Patient taking differently: Take 3 mLs by nebulization 4 (four) times daily.  09/10/14  Yes Aimee Du, MD  ramipril (ALTACE) 5 MG capsule Take 5 mg by mouth daily.  11/10/11  Yes Historical Provider, MD  ranitidine (ZANTAC) 75 MG tablet Take 75 mg by mouth daily as needed for heartburn.   Yes Historical Provider, MD    Family History Family History  Problem Relation Age of Onset  . Cancer Brother   . Cancer Sister   . Lung disease Mother   . Heart disease Mother   . Heart disease Father   . Stroke  Father   . Heart attack Brother   . Colon cancer Neg Hx     Social History Social History  Substance Use Topics  . Smoking status: Former Smoker    Packs/day: 1.00    Types: Cigarettes  . Smokeless tobacco: Never Used     Comment: quit about 3 yrs   . Alcohol use No     Allergies   Cephalexin; Coconut flavor; Codeine; Demerol; Other; and Sulfa antibiotics   Review of Systems Review of Systems  All other systems reviewed and are negative.    Physical Exam Updated Vital Signs BP 148/81   Pulse 76   Temp 98 F (36.7 C)   Resp (!) 30   Ht 5\' 2"  (1.575 m)   Wt 96 lb (43.5 kg)   SpO2 97%   BMI 17.56 kg/m   Physical Exam  Constitutional: She is oriented to person, place, and time.  Agitated, pressured speech, rapid pantene respirations. Very frail looking.    HENT:  Head: Normocephalic and atraumatic.  Eyes: Conjunctivae are normal.  Neck: Neck supple.  Cardiovascular: Normal rate and regular rhythm.   Pulmonary/Chest:  Tachypneic  Abdominal: Soft. Bowel sounds are normal.  Musculoskeletal: Normal range of motion.  Neurological: She is alert and oriented to person, place, and time.  Skin: Skin is warm and dry.  Psychiatric: She has a normal mood and affect. Her behavior is normal.  Nursing note and vitals reviewed.    ED Treatments / Results  DIAGNOSTIC STUDIES:  Oxygen Saturation is 97% on RA, normal by my interpretation.    COORDINATION OF CARE:  9:17 AM Will order breathing treatment and chest x-ray. Pt refused steroid treatment. Discussed treatment plan with pt at bedside and pt agreed to plan.  Labs (all labs ordered are listed, but only abnormal results are displayed) Labs Reviewed  BASIC METABOLIC PANEL - Abnormal; Notable for the following:       Result Value   Potassium 3.4 (*)    Glucose, Bld 123 (*)    All other components within normal limits  CBC WITH DIFFERENTIAL/PLATELET  TROPONIN I    EKG  EKG  Interpretation  Date/Time:  Saturday July 31 2016 09:01:42 EDT Ventricular Rate:  84 PR Interval:    QRS Duration: 101 QT Interval:  296 QTC Calculation: 335 R Axis:   80 Text Interpretation:  Atrial fibrillation Low voltage, precordial leads Nonspecific repol abnormality, diffuse leads Confirmed by Lacinda Axon  MD, Bunny Kleist ([id number]) on 07/31/2016 11:15:06 AM       Radiology Dg Chest 2 View  Result Date: 07/31/2016 CLINICAL DATA:  Pt reports sinus drainage  into chest making her short of breath. Pt very anxious on arrival and hyperventilating. Denies cough. EXAM: CHEST - 2 VIEW COMPARISON:  04/28/2016 and previous FINDINGS: Pulmonary  hyperinflation.  No focal infiltrate or overt edema. Heart size normal.  Tortuous atheromatous thoracic aorta. No effusion.  No pneumothorax. Mild spondylitic changes in the lower thoracic spine. IMPRESSION: 1. Hyperinflation without acute or superimposed abnormality. 2.  Aortic Atherosclerosis (ICD10-170.0) Electronically Signed   By: Lucrezia Europe M.D.   On: 07/31/2016 11:34    Procedures Procedures (including critical care time)  Medications Ordered in ED Medications  ipratropium-albuterol (DUONEB) 0.5-2.5 (3) MG/3ML nebulizer solution 3 mL (3 mLs Nebulization Given 07/31/16 1026)     Initial Impression / Assessment and Plan / ED Course  I have reviewed the triage vital signs and the nursing notes.  Pertinent labs & imaging results that were available during my care of the patient were reviewed by me and considered in my medical decision making (see chart for details).  Clinical Course    Patient feels better after nebulizer treatment. Chest x-ray shows no obvious pneumonia.  Discussed with patient and her husband. I suggested steroids for outpatient therapy. Patient declined. She will follow-up with Dr. Luan Pulling.  Final Clinical Impressions(s) / ED Diagnoses   Final diagnoses:  COPD exacerbation [facility])    New Prescriptions Discharge Medication List  as of 07/31/2016  1:09 PM    I personally performed the services described in this documentation, which was scribed in my presence. The recorded information has been reviewed and is accurate.      Aimee Christen, MD 08/01/16 [phone]

## 2016-07-31 NOTE — ED Notes (Addendum)
Pt dressed self and then laid down on bed.  States she can breathe better laying down.  States she cannot sit up long enough to sign for her discharge.  Verbalized understanding.

## 2016-07-31 NOTE — ED Notes (Signed)
Pt's husband came out requesting to bring food to patient. Explained to him that she had a breathing tx ordered and it would be preferred to wait. Husband said that the only way she could get her nasal drainage "up" was to eat. He then proceeded to question why we could not stick a tube down her throat and get rid of her nasal drainage and chest congestion.

## 2016-07-31 NOTE — ED Notes (Signed)
Pt refusing chest x ray at this time  

## 2016-07-31 NOTE — ED Notes (Addendum)
Pt called this RN into room. Pt stating, "y'all do something to help me. Put a tube down my throat or something." Explained to pt that there was no medical indication to intubate patient at this time. Pt also requesting a Foley catheter because she "has to push hard when she pees and it causes her lungs to collapse." Explained once again we don't do procedures just because the pt requests it.

## 2016-07-31 NOTE — Progress Notes (Signed)
Attempted to administer neb tx. Pt requesting to eat before she takes her tx. Pt then states, "i can't hardly breathe now, but I need to eat first to try to bring this stuff up". Advised pt I would check back in a few minutes. RT will notify RN. RT will continue to monitor.

## 2016-07-31 NOTE — Discharge Instructions (Signed)
User breathing treatments at home as necessary. Drink coffee as needed. Follow-up with your doctor next week.

## 2016-12-19 DIAGNOSIS — J449 Chronic obstructive pulmonary disease, unspecified: Secondary | ICD-10-CM | POA: Diagnosis not present

## 2016-12-19 DIAGNOSIS — E039 Hypothyroidism, unspecified: Secondary | ICD-10-CM | POA: Diagnosis not present

## 2016-12-19 DIAGNOSIS — I10 Essential (primary) hypertension: Secondary | ICD-10-CM | POA: Diagnosis not present

## 2016-12-19 DIAGNOSIS — M545 Low back pain: Secondary | ICD-10-CM | POA: Diagnosis not present

## 2016-12-19 DIAGNOSIS — R600 Localized edema: Secondary | ICD-10-CM | POA: Diagnosis not present

## 2017-01-16 DIAGNOSIS — I10 Essential (primary) hypertension: Secondary | ICD-10-CM | POA: Diagnosis not present

## 2017-01-16 DIAGNOSIS — R600 Localized edema: Secondary | ICD-10-CM | POA: Diagnosis not present

## 2017-02-16 DIAGNOSIS — R600 Localized edema: Secondary | ICD-10-CM | POA: Diagnosis not present

## 2017-02-16 DIAGNOSIS — I10 Essential (primary) hypertension: Secondary | ICD-10-CM | POA: Diagnosis not present

## 2017-02-22 DIAGNOSIS — I10 Essential (primary) hypertension: Secondary | ICD-10-CM | POA: Diagnosis not present

## 2017-02-22 DIAGNOSIS — R251 Tremor, unspecified: Secondary | ICD-10-CM | POA: Diagnosis not present

## 2017-02-22 DIAGNOSIS — E46 Unspecified protein-calorie malnutrition: Secondary | ICD-10-CM | POA: Diagnosis not present

## 2017-02-22 DIAGNOSIS — J449 Chronic obstructive pulmonary disease, unspecified: Secondary | ICD-10-CM | POA: Diagnosis not present

## 2017-07-15 ENCOUNTER — Emergency Department (HOSPITAL_COMMUNITY): Payer: Medicare Other

## 2017-07-15 ENCOUNTER — Emergency Department (HOSPITAL_COMMUNITY)
Admission: EM | Admit: 2017-07-15 | Discharge: 2017-07-15 | Disposition: A | Discharge status: Home / Self Care | Payer: Medicare Other | Attending: Emergency Medicine | Admitting: Emergency Medicine

## 2017-07-15 ENCOUNTER — Encounter (HOSPITAL_COMMUNITY): Payer: Self-pay

## 2017-07-15 DIAGNOSIS — Z87891 Personal history of nicotine dependence: Secondary | ICD-10-CM | POA: Diagnosis not present

## 2017-07-15 DIAGNOSIS — R0982 Postnasal drip: Secondary | ICD-10-CM | POA: Insufficient documentation

## 2017-07-15 DIAGNOSIS — R0602 Shortness of breath: Secondary | ICD-10-CM | POA: Diagnosis not present

## 2017-07-15 DIAGNOSIS — J449 Chronic obstructive pulmonary disease, unspecified: Secondary | ICD-10-CM | POA: Insufficient documentation

## 2017-07-15 DIAGNOSIS — R05 Cough: Secondary | ICD-10-CM | POA: Diagnosis not present

## 2017-07-15 DIAGNOSIS — I10 Essential (primary) hypertension: Secondary | ICD-10-CM | POA: Diagnosis not present

## 2017-07-15 DIAGNOSIS — Z79899 Other long term (current) drug therapy: Secondary | ICD-10-CM | POA: Insufficient documentation

## 2017-07-15 HISTORY — DX: Aortic aneurysm of unspecified site, without rupture: I71.9

## 2017-07-15 LAB — COMPREHENSIVE METABOLIC PANEL
ALT: 10 U/L — AB (ref 14–54)
AST: 22 U/L (ref 15–41)
Albumin: 4.3 g/dL (ref 3.5–5.0)
Alkaline Phosphatase: 46 U/L (ref 38–126)
Anion gap: 9 (ref 5–15)
BILIRUBIN TOTAL: 0.3 mg/dL (ref 0.3–1.2)
BUN: 9 mg/dL (ref 6–20)
CO2: 27 mmol/L (ref 22–32)
Calcium: 9.4 mg/dL (ref 8.9–10.3)
Chloride: 99 mmol/L — ABNORMAL LOW (ref 101–111)
Creatinine, Ser: 0.82 mg/dL (ref 0.44–1.00)
Glucose, Bld: 85 mg/dL (ref 65–99)
Potassium: 4.1 mmol/L (ref 3.5–5.1)
Sodium: 135 mmol/L (ref 135–145)
Total Protein: 6.8 g/dL (ref 6.5–8.1)

## 2017-07-15 LAB — CBC WITH DIFFERENTIAL/PLATELET
BASOS ABS: 0 10*3/uL (ref 0.0–0.1)
Basophils Relative: 0 %
EOS PCT: 1 %
Eosinophils Absolute: 0.1 10*3/uL (ref 0.0–0.7)
HEMATOCRIT: 40.9 % (ref 36.0–46.0)
Hemoglobin: 13.9 g/dL (ref 12.0–15.0)
LYMPHS ABS: 1.7 10*3/uL (ref 0.7–4.0)
LYMPHS PCT: 24 %
MCH: 30.6 pg (ref 26.0–34.0)
MCHC: 34 g/dL (ref 30.0–36.0)
MCV: 90.1 fL (ref 78.0–100.0)
MONO ABS: 0.6 10*3/uL (ref 0.1–1.0)
Monocytes Relative: 8 %
NEUTROS ABS: 4.6 10*3/uL (ref 1.7–7.7)
Neutrophils Relative %: 67 %
PLATELETS: 283 10*3/uL (ref 150–400)
RBC: 4.54 MIL/uL (ref 3.87–5.11)
RDW: 13.1 % (ref 11.5–15.5)
WBC: 6.9 10*3/uL (ref 4.0–10.5)

## 2017-07-15 LAB — I-STAT CG4 LACTIC ACID, ED: Lactic Acid, Venous: 1.9 mmol/L (ref 0.5–1.9)

## 2017-07-15 LAB — BRAIN NATRIURETIC PEPTIDE: B Natriuretic Peptide: 28 pg/mL (ref 0.0–100.0)

## 2017-07-15 LAB — TROPONIN I: Troponin I: <0.03 ng/mL (ref <0.03)

## 2017-07-15 NOTE — Discharge Instructions (Signed)
Your testing showed no pneumonia - normal blood work. Take the Zithromax as your doctor has prescribed you today. ER for worsening symptoms.

## 2017-07-15 NOTE — ED Notes (Signed)
Patient given discharge instruction, verbalized understand. IV removed, band aid applied. Patient ambulatory out of the department.  

## 2017-07-15 NOTE — ED Provider Notes (Signed)
[facility] DEPT Provider Note   CSN: [id number] Arrival date & time: 07/15/17  1030     History   Chief Complaint Chief Complaint  Patient presents with  . Shortness of Breath    HPI Aimee Rodriguez is a 66 y.o. female.  HPI  The patient is a 66 year old female, she has a known history of anxiety as well as a history of what she describes as arrhythmia but is unsure exactly which one it was. It appears that she has had an ablation for paroxysmal SVT. She also has hypertension, COPD though she does not smoke any longer and has had chronic sinus drainage and postnasal drip which has been a source of significant frustration for this patient. She reports that she was last prescribed antibiotics about 3 months ago and reports that she was noncompliant with this because it did not help after 1 or 2 doses. She has dealt with chronic postnasal drip since that time and over the last 2 days has had increased amounts of purulent phlegm and mucus that she is trying to get out of the back of her throat and her lungs. She denies fevers chills or chest pain, she denies swelling of the legs abdominal pain diarrhea.  Past Medical History:  Diagnosis Date  . Anxiety   . Aortic aneurysm ([place])   . Cardiac dysrhythmia, unspecified   . Chronic bronchitis   . 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 Degeneration of intervertebral disc   . Essential and other specified forms of tremor 06/28/2014  . HTN (hypertension)   . Paroxysmal supraventricular tachycardia ([facility])    s/p ablation in past  . Reflux esophagitis     Patient Active Problem List   Diagnosis Date Noted  . COPD exacerbation ([place]) 09/07/2014  . Essential and other specified forms of tremor 06/28/2014  . Coronary atherosclerosis 09/13/2013  . HTN (hypertension) 08/16/2013  . Lung nodule 08/16/2013  . Aortic dilatation ([place]) 08/16/2013  . Weight loss 11/23/2011  . Epigastric pain 11/23/2011    Past  Surgical History:  Procedure Laterality Date  . BACK SURGERY    . cardiac ablation for paroxysmal supraventricular tachycardia    . CHOLECYSTECTOMY    . ESOPHAGOGASTRODUODENOSCOPY  11/25/2011   Procedure: ESOPHAGOGASTRODUODENOSCOPY (EGD);  Surgeon: Daneil Dolin, MD;  Location: [facility] SU[facility];  Service: Endoscopy;  Laterality: N/A;  12:45  . FOOT SURGERY Right    X2  . HEMORROIDECTOMY    . LAPAROTOMY    . NECK SURGERY    . OOPHORECTOMY    . polyps of colon    . TONSILLECTOMY    . TOTAL THYROIDECTOMY    . VAGINAL HYSTERECTOMY      OB History    Gravida Para Term Preterm AB Living   4 4 4          SAB TAB Ectopic Multiple Live Births                   Home Medications    Prior to Admission medications   Medication Sig Start Date End Date Taking? Authorizing Provider  ALPRAZolam Duanne Moron) 1 MG tablet Take 1 mg by mouth 3 (three) times daily as needed for anxiety.    Yes [provider]  bisacodyl (BISACODYL) 5 MG EC tablet Take 5 mg by mouth daily as needed for mild constipation or moderate constipation.   Yes [provider]  clonazePAM (KLONOPIN) 1 MG tablet Take 1 mg by  mouth 2 (two) times daily as needed for anxiety.    Yes [provider]  guaiFENesin (MUCINEX) 600 MG 12 hr tablet Take 600-1,200 mg by mouth daily as needed for cough or to loosen phlegm.    Yes [provider]  ipratropium-albuterol (DUONEB) 0.5-2.5 (3) MG/3ML SOLN Take 3 mLs by nebulization every 4 (four) hours. Patient taking differently: Take 3 mLs by nebulization 4 (four) times daily.  09/10/14  Yes Sinda Du, MD  ramipril (ALTACE) 5 MG capsule Take 5 mg by mouth daily.  11/10/11  Yes [provider]  ranitidine (ZANTAC) 150 MG tablet Take 150 mg by mouth daily.   Yes [provider]  cephALEXin (KEFLEX) 500 MG capsule Take 500 mg by mouth daily as needed (INFECTION).     [provider]    Family History Family History  Problem Relation  Age of Onset  . Cancer Brother   . Cancer Sister   . Lung disease Mother   . Heart disease Mother   . Heart disease Father   . Stroke Father   . Heart attack Brother   . Colon cancer Neg Hx     Social History Social History  Substance Use Topics  . Smoking status: Former Smoker    Packs/day: 1.00    Types: Cigarettes  . Smokeless tobacco: Never Used     Comment: quit about 3 yrs   . Alcohol use No     Allergies   Cephalexin; Coconut flavor; Codeine; Demerol; Other; and Sulfa antibiotics   Review of Systems Review of Systems  All other systems reviewed and are negative.    Physical Exam Updated Vital Signs BP (!) 108/59   Pulse 76   Temp 98.1 F (36.7 C) (Oral)   Resp (!) 21   Ht 5\' 2"  (1.575 m)   Wt 41.2 kg (90 lb 12.8 oz)   SpO2 96%   BMI 16.61 kg/m   Physical Exam  Constitutional: She appears well-developed and well-nourished. No distress.  HENT:  Head: Normocephalic and atraumatic.  Mouth/Throat: Oropharynx is clear and moist. No oropharyngeal exudate.  OP is clear and moist - no signs of drainage  Eyes: Pupils are equal, round, and reactive to light. Conjunctivae and EOM are normal. Right eye exhibits no discharge. Left eye exhibits no discharge. No scleral icterus.  Neck: Normal range of motion. Neck supple. No JVD present. No thyromegaly present.  Cardiovascular: Normal rate, regular rhythm, normal heart sounds and intact distal pulses.  Exam reveals no gallop and no friction rub.   No murmur heard. Pulmonary/Chest: No respiratory distress. She has wheezes ( slight expiratory wheeze). She has no rales.  Tachypneic but speaks in full sentences  Abdominal: Soft. Bowel sounds are normal. She exhibits no distension and no mass. There is no tenderness.  Musculoskeletal: Normal range of motion. She exhibits no edema or tenderness.  Lymphadenopathy:    She has no cervical adenopathy.  Neurological: She is alert. Coordination normal.  Skin: Skin is warm  and dry. No rash noted. No erythema.  Psychiatric: She has a normal mood and affect. Her behavior is normal.  Nursing note and vitals reviewed.    ED Treatments / Results  Labs (all labs ordered are listed, but only abnormal results are displayed) Labs Reviewed  COMPREHENSIVE METABOLIC PANEL - Abnormal; Notable for the following:       Result Value   Chloride 99 (*)    ALT 10 (*)    All other components  within normal limits  CBC WITH DIFFERENTIAL/PLATELET  TROPONIN I  BRAIN NATRIURETIC PEPTIDE  I-STAT CG4 LACTIC ACID, ED  I-STAT CG4 LACTIC ACID, ED   EKG  EKG Interpretation  Date/Time:  Friday July 15 2017 10:41:55 EDT Ventricular Rate:  100 PR Interval:    QRS Duration: 69 QT Interval:  414 QTC Calculation: 534 R Axis:   84 Text Interpretation:  Atrial flutter/fibrillation Borderline right axis deviation Borderline repolarization abnormality Minimal ST elevation, lateral leads Prolonged QT interval since last tracing no significant change Confirmed by Noemi Chapel [phone]) on 07/15/2017 10:51:13 AM       Radiology Dg Chest 2 View  Result Date: 07/15/2017 CLINICAL DATA:  Shortness of breath, cough EXAM: CHEST  2 VIEW COMPARISON:  07/31/2016 FINDINGS: There is hyperinflation of the lungs compatible with COPD. Heart and mediastinal contours are within normal limits. No focal opacities or effusions. No acute bony abnormality. IMPRESSION: COPD.  No active disease. Electronically Signed   By: Rolm Baptise M.D.   On: 07/15/2017 12:11    Procedures Procedures (including critical care time)  Medications Ordered in ED Medications - No data to display   Initial Impression / Assessment and Plan / ED Course  I have reviewed the triage vital signs and the nursing notes.  Pertinent labs & imaging results that were available during my care of the patient were reviewed by me and considered in my medical decision making (see chart for details).     Xray and labs normal Pt  still states PND is what is bothering her No visible drainage, Airway is patent, no distress Clear speech. They had the nurse at their PCP (Dr. Luan Pulling office) call in a Z pack while she was here - I see no indication to treat with any other medicines.  Final Clinical Impressions(s) / ED Diagnoses   Final diagnoses:  PND (post-nasal drip)    New Prescriptions New Prescriptions   No medications on file     Noemi Chapel, MD 07/15/17 1338

## 2017-07-15 NOTE — ED Triage Notes (Addendum)
Pt reports SOB that became worse yesterday. Only able to ambulate to and from BR. Coughing up frothy white mucous for years Does not wear 02 at home. Pt states cough started yesterday and became worse today. Pt very shaky . Pt took one neb this am, but makes her cough

## 2018-01-25 DIAGNOSIS — E46 Unspecified protein-calorie malnutrition: Secondary | ICD-10-CM | POA: Diagnosis not present

## 2018-01-25 DIAGNOSIS — I10 Essential (primary) hypertension: Secondary | ICD-10-CM | POA: Diagnosis not present

## 2018-01-25 DIAGNOSIS — J449 Chronic obstructive pulmonary disease, unspecified: Secondary | ICD-10-CM | POA: Diagnosis not present

## 2018-02-28 DIAGNOSIS — R069 Unspecified abnormalities of breathing: Secondary | ICD-10-CM | POA: Diagnosis not present

## 2018-03-12 DIAGNOSIS — R0682 Tachypnea, not elsewhere classified: Secondary | ICD-10-CM | POA: Diagnosis not present

## 2018-03-15 DIAGNOSIS — E46 Unspecified protein-calorie malnutrition: Secondary | ICD-10-CM | POA: Diagnosis not present

## 2018-03-15 DIAGNOSIS — J449 Chronic obstructive pulmonary disease, unspecified: Secondary | ICD-10-CM | POA: Diagnosis not present

## 2018-03-15 DIAGNOSIS — I10 Essential (primary) hypertension: Secondary | ICD-10-CM | POA: Diagnosis not present

## 2018-03-15 DIAGNOSIS — J9611 Chronic respiratory failure with hypoxia: Secondary | ICD-10-CM | POA: Diagnosis not present

## 2018-03-29 DIAGNOSIS — J449 Chronic obstructive pulmonary disease, unspecified: Secondary | ICD-10-CM | POA: Diagnosis not present

## 2018-03-29 DIAGNOSIS — R600 Localized edema: Secondary | ICD-10-CM | POA: Diagnosis not present

## 2018-04-29 DIAGNOSIS — R600 Localized edema: Secondary | ICD-10-CM | POA: Diagnosis not present

## 2018-04-29 DIAGNOSIS — J449 Chronic obstructive pulmonary disease, unspecified: Secondary | ICD-10-CM | POA: Diagnosis not present

## 2018-05-29 DIAGNOSIS — R600 Localized edema: Secondary | ICD-10-CM | POA: Diagnosis not present

## 2018-05-29 DIAGNOSIS — J449 Chronic obstructive pulmonary disease, unspecified: Secondary | ICD-10-CM | POA: Diagnosis not present

## 2018-06-29 DIAGNOSIS — J449 Chronic obstructive pulmonary disease, unspecified: Secondary | ICD-10-CM | POA: Diagnosis not present

## 2018-06-29 DIAGNOSIS — R600 Localized edema: Secondary | ICD-10-CM | POA: Diagnosis not present

## 2018-07-08 DIAGNOSIS — J449 Chronic obstructive pulmonary disease, unspecified: Secondary | ICD-10-CM | POA: Diagnosis not present

## 2018-08-01 DIAGNOSIS — E46 Unspecified protein-calorie malnutrition: Secondary | ICD-10-CM | POA: Diagnosis not present

## 2018-08-01 DIAGNOSIS — J449 Chronic obstructive pulmonary disease, unspecified: Secondary | ICD-10-CM | POA: Diagnosis not present

## 2018-08-01 DIAGNOSIS — J9611 Chronic respiratory failure with hypoxia: Secondary | ICD-10-CM | POA: Diagnosis not present

## 2018-08-07 DIAGNOSIS — J449 Chronic obstructive pulmonary disease, unspecified: Secondary | ICD-10-CM | POA: Diagnosis not present

## 2018-08-17 ENCOUNTER — Other Ambulatory Visit (HOSPITAL_COMMUNITY): Payer: Self-pay | Admitting: Pulmonary Disease

## 2018-08-17 DIAGNOSIS — R911 Solitary pulmonary nodule: Secondary | ICD-10-CM

## 2018-09-06 ENCOUNTER — Ambulatory Visit (HOSPITAL_COMMUNITY)
Admission: RE | Admit: 2018-09-06 | Discharge: 2018-09-06 | Disposition: A | Discharge status: Home / Self Care | Payer: Medicare Other | Source: Ambulatory Visit | Attending: Pulmonary Disease | Admitting: Pulmonary Disease

## 2018-09-06 DIAGNOSIS — I712 Thoracic aortic aneurysm, without rupture: Secondary | ICD-10-CM | POA: Insufficient documentation

## 2018-09-06 DIAGNOSIS — J439 Emphysema, unspecified: Secondary | ICD-10-CM | POA: Diagnosis not present

## 2018-09-06 DIAGNOSIS — I25810 Atherosclerosis of coronary artery bypass graft(s) without angina pectoris: Secondary | ICD-10-CM | POA: Diagnosis not present

## 2018-09-06 DIAGNOSIS — R911 Solitary pulmonary nodule: Secondary | ICD-10-CM | POA: Diagnosis not present

## 2018-09-06 DIAGNOSIS — R918 Other nonspecific abnormal finding of lung field: Secondary | ICD-10-CM | POA: Diagnosis not present

## 2018-09-06 LAB — POCT I-STAT CREATININE: CREATININE: 0.8 mg/dL (ref 0.44–1.00)

## 2018-09-06 MED ORDER — IOHEXOL 300 MG/ML  SOLN
75.0000 mL | Freq: Once | INTRAMUSCULAR | Status: AC | PRN
  Administered 2018-09-06: 75 mL via INTRAVENOUS

## 2018-09-07 DIAGNOSIS — J449 Chronic obstructive pulmonary disease, unspecified: Secondary | ICD-10-CM | POA: Diagnosis not present

## 2018-10-07 DIAGNOSIS — J449 Chronic obstructive pulmonary disease, unspecified: Secondary | ICD-10-CM | POA: Diagnosis not present

## 2018-11-07 DIAGNOSIS — J449 Chronic obstructive pulmonary disease, unspecified: Secondary | ICD-10-CM | POA: Diagnosis not present

## 2018-12-08 DIAGNOSIS — J449 Chronic obstructive pulmonary disease, unspecified: Secondary | ICD-10-CM | POA: Diagnosis not present

## 2019-01-06 DIAGNOSIS — J449 Chronic obstructive pulmonary disease, unspecified: Secondary | ICD-10-CM | POA: Diagnosis not present

## 2019-01-21 IMAGING — CT CT CHEST W/ CM
2 of 3 series · 15 of 36 positions shown, 18 images · IV contrast (omnipaque)
Comparison: Chest x-ray 07/15/2017.  Chest CT 05/23/2015.

ADDENDUM:
4 cm ascending thoracic aortic aneurysm. Recommend annual imaging
followup by CTA or MRA. This recommendation follows 1505
ACCF/AHA/AATS/ACR/ASA/SCA/SERVELLON/AYAKA/MARTINEAU/AZOLA Guidelines for the
Diagnosis and Management of Patients with Thoracic Aortic Disease.
Circulation. 1505; 121: [id number]
CLINICAL DATA: Follow-up pulmonary nodule

EXAM:
CT CHEST WITH CONTRAST
TECHNIQUE: Multidetector CT imaging of the chest was performed during
intravenous contrast administration.
CONTRAST:  75mL OMNIPAQUE IOHEXOL 300 MG/ML  SOLN

[Series 2: axial st · axial · 0.62mm/px · z∈[+1073,+1363]mm · 12 of 171 slices shown, 15 images]
[im 13/171  mediastinal]
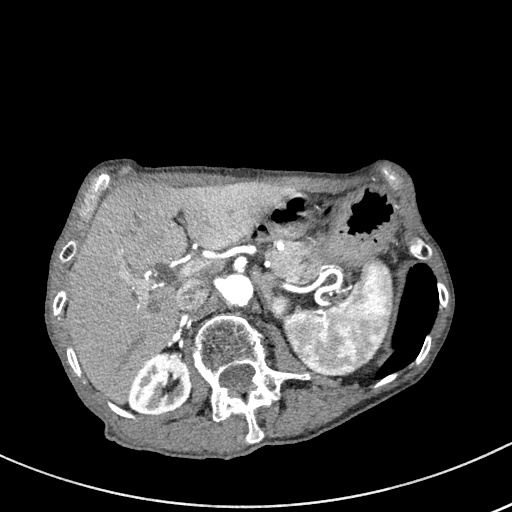
[im 13/171  lung]
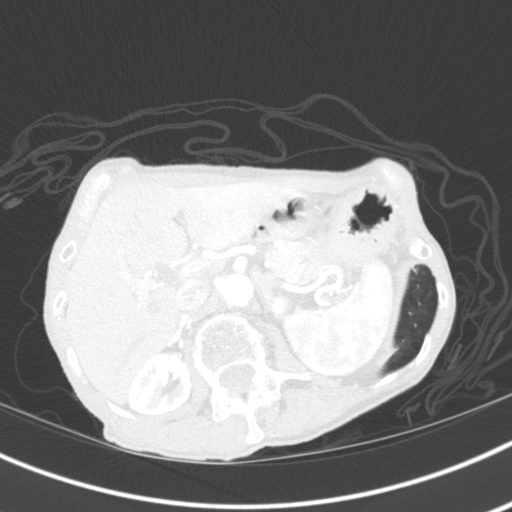
[im 26/171  lung]
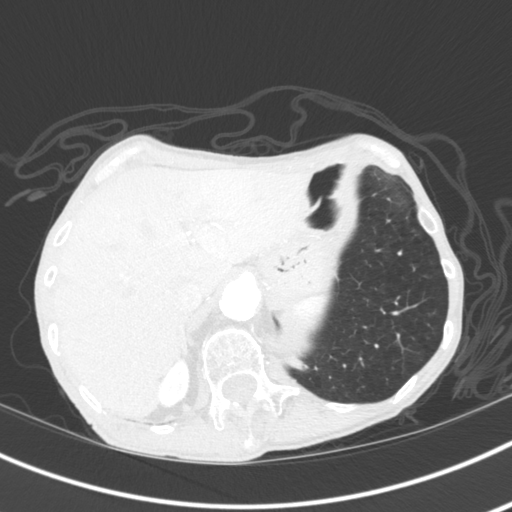
[im 38/171  lung]
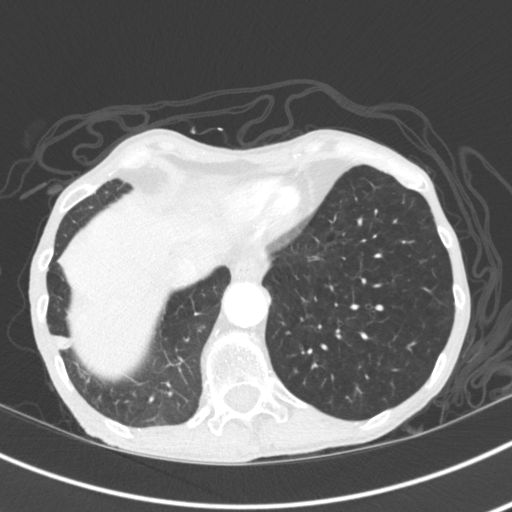
[im 51/171  lung]
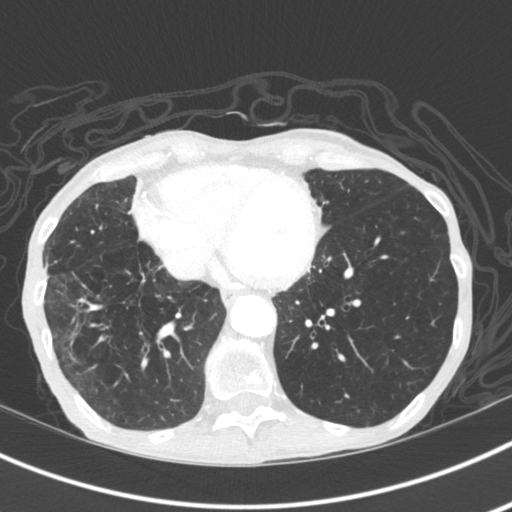
[im 63/171  mediastinal]
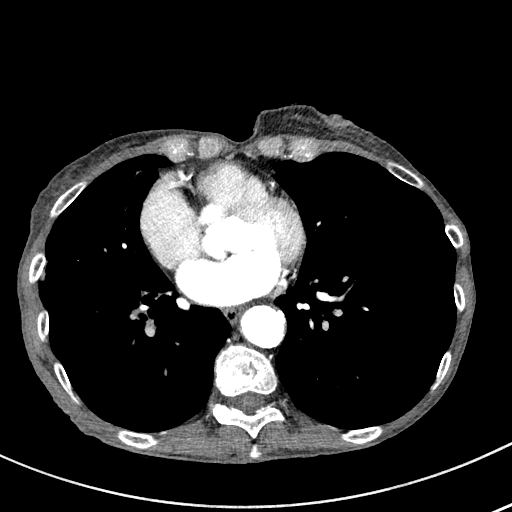
[im 63/171  lung]
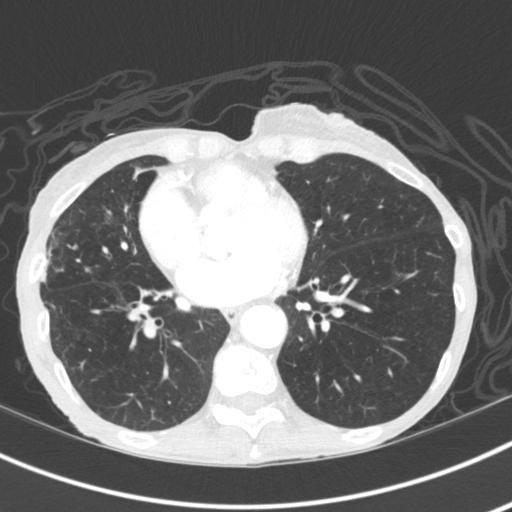
[im 76/171  lung]
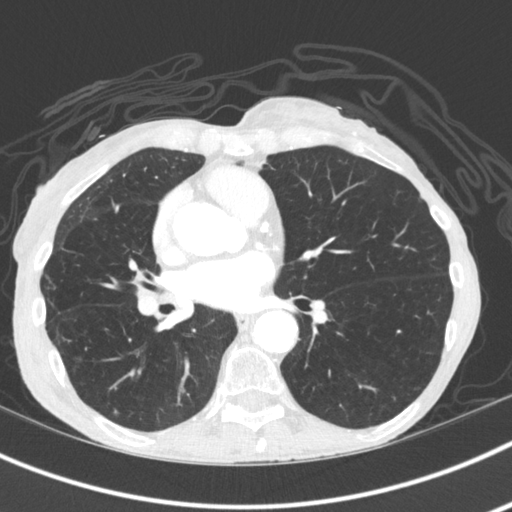
[im 95/171  lung]
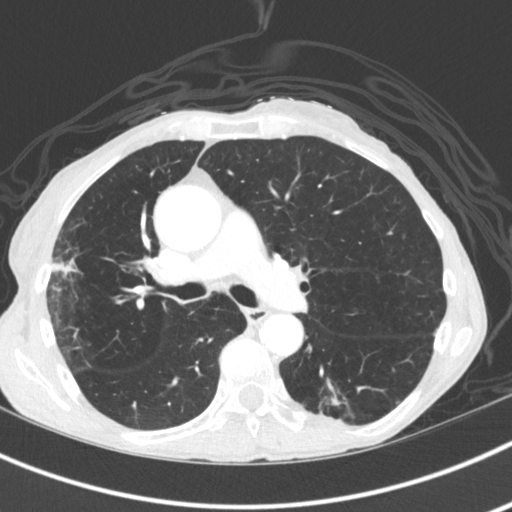
[im 108/171  lung]
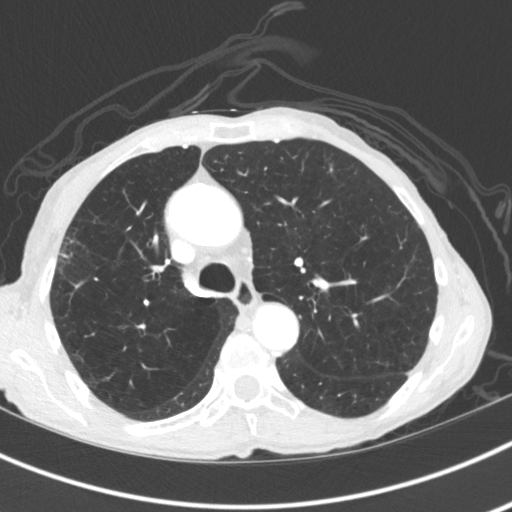
[im 120/171  mediastinal]
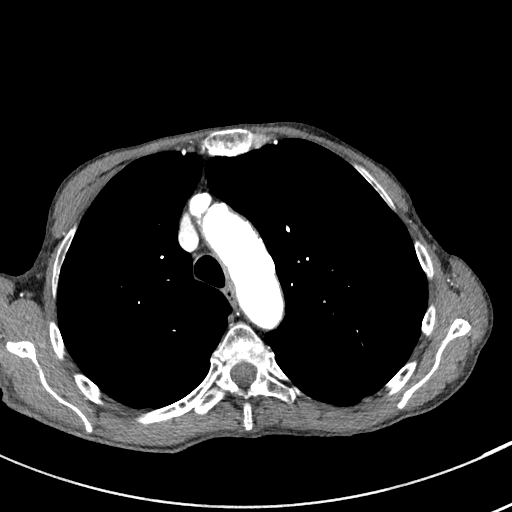
[im 120/171  lung]
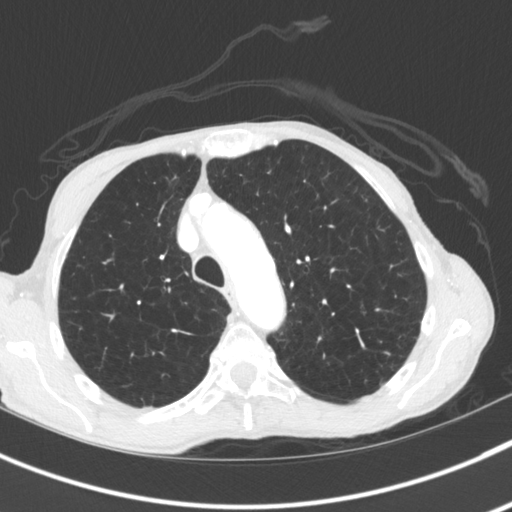
[im 133/171  lung]
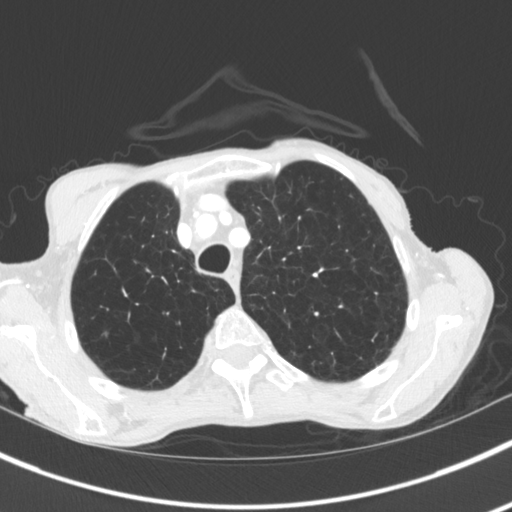
[im 145/171  lung]
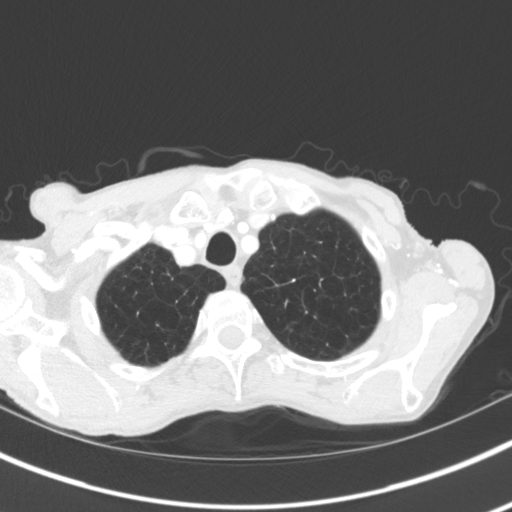
[im 158/171  lung]
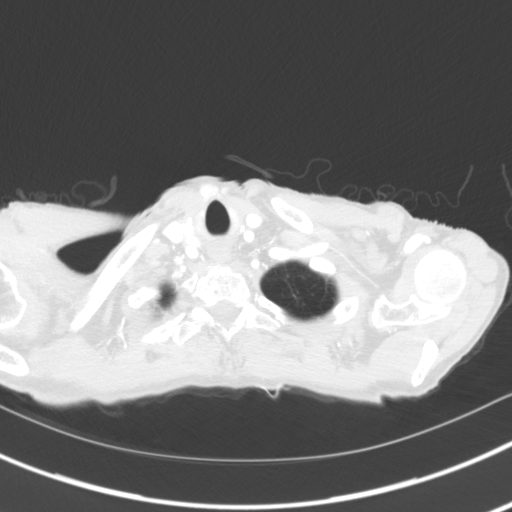

[Series 5: coronal · coronal · 0.54mm/px · 3 of 132 slices shown]
[im 27/132  lung]
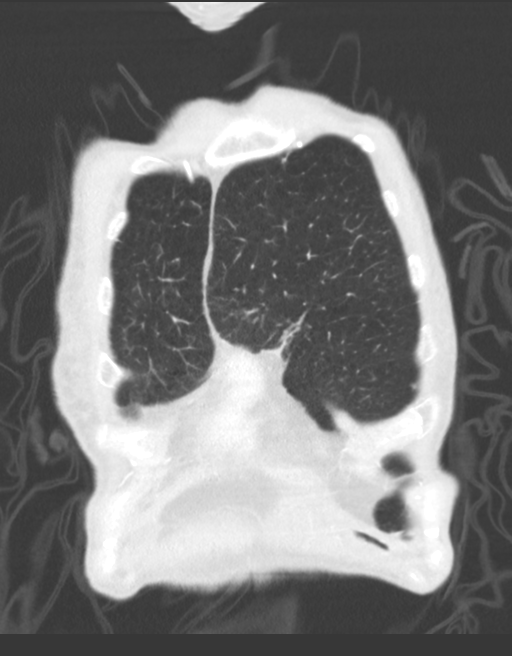
[im 53/132  lung]
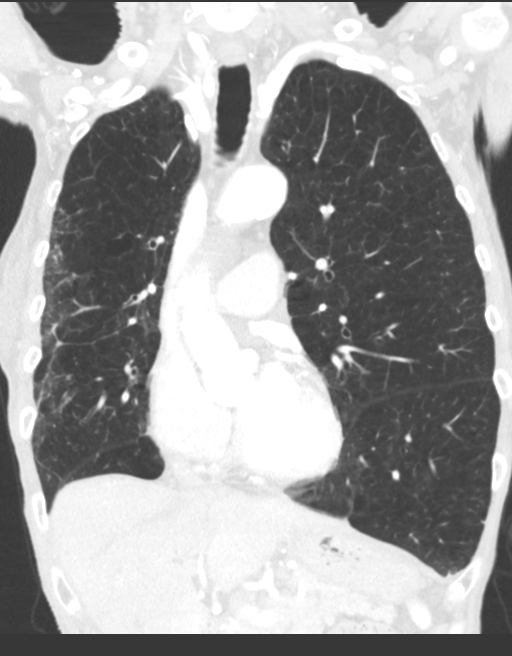
[im 79/132  lung]
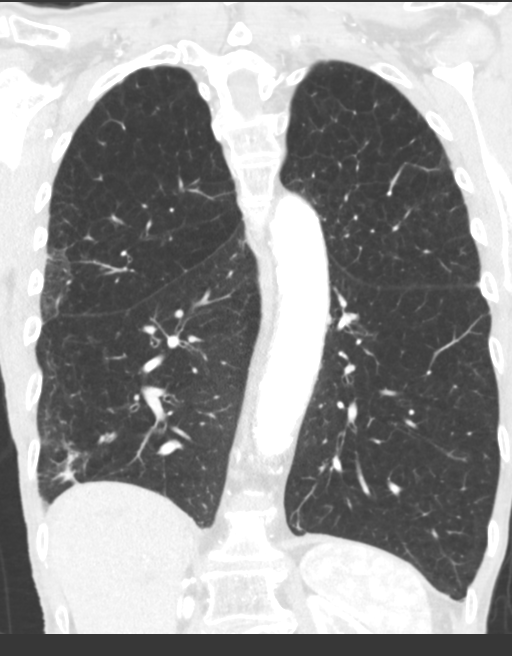

[15 of 36 positions shown; findings below may reference images not displayed]

FINDINGS: Cardiovascular: Diffuse aortic atherosclerosis. Moderate coronary
artery disease. Heart is upper limits normal in size. Early
dilatation of the ascending thoracic aorta, 4 cm, stable. No
dissection.

Mediastinum/Nodes: No mediastinal, hilar, or axillary adenopathy.

Lungs/Pleura: Advanced emphysema. Peripheral opacities are new in
the right lung, most pronounced in the right upper lobe. Small
scattered peripheral nodules in the left lung. No new or enlarging
pulmonary nodules. No effusions..

Upper Abdomen: Imaging into the upper abdomen shows no acute
findings.

Musculoskeletal: Chest wall soft tissues are unremarkable. No acute
bony abnormality.
IMPRESSION: Severe emphysema.

Diffuse aortic atherosclerosis, coronary artery disease.

Stable scattered small predominantly peripheral pulmonary nodules,
with no new or enlarging pulmonary nodules.

New peripheral opacities with interstitial/interlobular thickening
in the right lung, most pronounced in the right upper lobe. Favor
this represents early scarring/fibrosis.

## 2019-02-05 DIAGNOSIS — M545 Low back pain: Secondary | ICD-10-CM | POA: Diagnosis not present

## 2019-02-05 DIAGNOSIS — E039 Hypothyroidism, unspecified: Secondary | ICD-10-CM | POA: Diagnosis not present

## 2019-02-05 DIAGNOSIS — J449 Chronic obstructive pulmonary disease, unspecified: Secondary | ICD-10-CM | POA: Diagnosis not present

## 2019-02-05 DIAGNOSIS — I10 Essential (primary) hypertension: Secondary | ICD-10-CM | POA: Diagnosis not present

## 2019-03-08 DIAGNOSIS — I10 Essential (primary) hypertension: Secondary | ICD-10-CM | POA: Diagnosis not present

## 2019-03-08 DIAGNOSIS — J449 Chronic obstructive pulmonary disease, unspecified: Secondary | ICD-10-CM | POA: Diagnosis not present

## 2019-03-08 DIAGNOSIS — M545 Low back pain: Secondary | ICD-10-CM | POA: Diagnosis not present

## 2019-03-08 DIAGNOSIS — E039 Hypothyroidism, unspecified: Secondary | ICD-10-CM | POA: Diagnosis not present

## 2019-03-13 DIAGNOSIS — R1310 Dysphagia, unspecified: Secondary | ICD-10-CM | POA: Diagnosis not present

## 2019-03-13 DIAGNOSIS — J301 Allergic rhinitis due to pollen: Secondary | ICD-10-CM | POA: Diagnosis not present

## 2019-03-13 DIAGNOSIS — J449 Chronic obstructive pulmonary disease, unspecified: Secondary | ICD-10-CM | POA: Diagnosis not present

## 2019-06-13 DIAGNOSIS — M545 Low back pain: Secondary | ICD-10-CM | POA: Diagnosis not present

## 2019-06-13 DIAGNOSIS — J449 Chronic obstructive pulmonary disease, unspecified: Secondary | ICD-10-CM | POA: Diagnosis not present

## 2019-06-13 DIAGNOSIS — E46 Unspecified protein-calorie malnutrition: Secondary | ICD-10-CM | POA: Diagnosis not present

## 2019-06-13 DIAGNOSIS — I10 Essential (primary) hypertension: Secondary | ICD-10-CM | POA: Diagnosis not present

## 2019-12-10 DEATH — deceased
